# Patient Record
Sex: Male | Born: 1995 | Hispanic: Yes | Marital: Single | Smoking: Former smoker
Health system: Southern US, Community
[De-identification: ages and names within clinical notes are randomized; demographics above are authoritative.]

## PROBLEM LIST (undated history)

## (undated) DIAGNOSIS — J342 Deviated nasal septum: Secondary | ICD-10-CM

## (undated) HISTORY — PX: OTHER SURGICAL HISTORY: SHX169

---

## 2017-07-18 ENCOUNTER — Encounter: Payer: Self-pay | Admitting: Physician Assistant

## 2017-07-18 ENCOUNTER — Ambulatory Visit (INDEPENDENT_AMBULATORY_CARE_PROVIDER_SITE_OTHER): Payer: Worker's Compensation | Admitting: Physician Assistant

## 2017-07-18 VITALS — BP 138/88 | HR 84 | Temp 98.0°F | Resp 17 | Ht 67.5 in | Wt 213.0 lb

## 2017-07-18 DIAGNOSIS — G8929 Other chronic pain: Secondary | ICD-10-CM | POA: Insufficient documentation

## 2017-07-18 DIAGNOSIS — M25512 Pain in left shoulder: Secondary | ICD-10-CM
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
## 2017-07-18 NOTE — Patient Instructions (Signed)
     IF you received an x-ray today, you will receive an invoice from [facility]. Please contact [facility] at [phone] with questions or concerns regarding your invoice.   IF you received labwork today, you will receive an invoice from [facility]. Please contact [facility] at [phone] with questions or concerns regarding your invoice.   Our billing staff will not be able to assist you with questions regarding bills from these companies.  You will be contacted with the lab results as soon as they are available. The fastest way to get your results is to activate your My Chart account. Instructions are located on the last page of this paperwork. If you have not heard from us regarding the results in 2 weeks, please contact this office.     

## 2018-08-23 ENCOUNTER — Encounter: Payer: Self-pay | Admitting: Family

## 2018-08-23 ENCOUNTER — Ambulatory Visit: Payer: 59 | Admitting: Family Medicine

## 2018-08-23 ENCOUNTER — Encounter (INDEPENDENT_AMBULATORY_CARE_PROVIDER_SITE_OTHER): Payer: Self-pay

## 2018-08-23 ENCOUNTER — Ambulatory Visit (INDEPENDENT_AMBULATORY_CARE_PROVIDER_SITE_OTHER): Payer: 59 | Admitting: Family

## 2018-08-23 ENCOUNTER — Ambulatory Visit (INDEPENDENT_AMBULATORY_CARE_PROVIDER_SITE_OTHER)
Admission: RE | Admit: 2018-08-23 | Discharge: 2018-08-23 | Disposition: A | Discharge status: Home / Self Care | Payer: 59 | Source: Ambulatory Visit | Attending: Family | Admitting: Family

## 2018-08-23 VITALS — BP 130/90 | HR 95 | Temp 98.5°F | Ht 67.5 in | Wt 214.1 lb

## 2018-08-23 DIAGNOSIS — R059 Cough, unspecified: Secondary | ICD-10-CM

## 2018-08-23 DIAGNOSIS — R05 Cough: Secondary | ICD-10-CM

## 2018-08-23 MED ORDER — DOXYCYCLINE HYCLATE 100 MG PO TABS: 100.0000 mg | ORAL_TABLET | Freq: Two times a day (BID) | ORAL | 0 refills | Status: DC

## 2018-08-23 MED ORDER — PREDNISONE 20 MG PO TABS: 40.0000 mg | ORAL_TABLET | Freq: Every day | ORAL | 0 refills | Status: DC

## 2018-08-23 NOTE — Progress Notes (Signed)
Jon Rose is a 22 y.o. male with the following history as recorded in EpicCare:  Patient Active Problem List   Diagnosis Date Noted  . Chronic left shoulder pain 07/18/2017    Current Outpatient Medications  Medication Sig Dispense Refill  . doxycycline (VIBRA-TABS) 100 MG tablet Take 1 tablet (100 mg total) by mouth 2 (two) times daily. 20 tablet 0  . predniSONE (DELTASONE) 20 MG tablet Take 2 tablets (40 mg total) by mouth daily with breakfast. 10 tablet 0   No current facility-administered medications for this visit.     Allergies: Patient has no known allergies.  History reviewed. No pertinent past medical history.  Past Surgical History:  Procedure Laterality Date  . left shoulder surgery      History reviewed. No pertinent family history.  Social History   Tobacco Use  . Smoking status: Current Some Day Smoker  . Smokeless tobacco: Never Used  Substance Use Topics  . Alcohol use: No    Subjective:  Seen as a new patient today; Patient presents today with concerns for cough/ congestion x 2-3 days; worse in the past 24 hours; + audible wheezing; not prone to asthma but thinks he has had bronchitis in the past; mentions that he has had pneumonia on at least 2 different occasions;  + occasional smoker; + feverish- has taken Ibuprofen today; no recent travel; works with Database administrator in school at [employer]:     Objective:  Vitals:   08/23/18 1609  BP: 130/90  Pulse: 95  Temp: 98.5 F (36.9 C)  TempSrc: Oral  SpO2: 98%  Weight: 214 lb 1.3 oz (97.1 kg)  Height: 5' 7.5" (1.715 m)    General: Well developed, well nourished, in no acute distress; obviously doe  Skin : Warm and dry.  Head: Normocephalic and atraumatic  Eyes: Sclera and conjunctiva clear; pupils round and reactive to light; extraocular movements intact  Ears: External normal; canals clear; tympanic membranes normal  Oropharynx: Pink, supple. No suspicious lesions  Neck: Supple without thyromegaly, adenopathy   Lungs: Respirations unlabored; wheezing noted in all 4 lobes CVS exam: normal rate and regular rhythm.  Neurologic: Alert and oriented; speech intact; face symmetrical; moves all extremities well; CNII-XII intact without focal deficit  Assessment:   1. Cough     Plan:  Concern for pneumonia; albuterol neb treatment given with some relief; update CXR today; Rx for Doxycycline 100 mg bid x 10 days and prednisone 40 mg qd x 5 days; increase fluids, rest; needs to quit smoking; strict ER/ U/C precautions for upcoming weekend.    No follow-ups on file.  Orders Placed This Encounter  Procedures  . DG Chest 2 View    Standing Status:   Future    Number of Occurrences:   1    Standing Expiration Date:   10/24/2019    Order Specific Question:   Reason for Exam (SYMPTOM  OR DIAGNOSIS REQUIRED)    Answer:   cough    Order Specific Question:   Preferred imaging location?    Answer:   [facility]    Order Specific Question:   Radiology Contrast Protocol - do NOT remove file path    Answer:   [email or web address]    Requested Prescriptions   Signed Prescriptions Disp Refills  . doxycycline (VIBRA-TABS) 100 MG tablet 20 tablet 0    Sig: Take 1 tablet (100 mg total) by mouth 2 (two) times daily.  . predniSONE (DELTASONE) 20 MG tablet 10 tablet 0  Sig: Take 2 tablets (40 mg total) by mouth daily with breakfast.

## 2018-12-02 ENCOUNTER — Ambulatory Visit: Payer: Self-pay | Admitting: Nurse Practitioner

## 2019-02-12 ENCOUNTER — Other Ambulatory Visit: Payer: Self-pay

## 2019-02-12 ENCOUNTER — Encounter (HOSPITAL_COMMUNITY): Payer: Self-pay

## 2019-02-12 ENCOUNTER — Emergency Department (HOSPITAL_COMMUNITY)
Admission: EM | Admit: 2019-02-12 | Discharge: 2019-02-12 | Disposition: A | Discharge status: Home / Self Care | Payer: 59 | Attending: Emergency Medicine | Admitting: Emergency Medicine

## 2019-02-12 DIAGNOSIS — J111 Influenza due to unidentified influenza virus with other respiratory manifestations: Secondary | ICD-10-CM | POA: Diagnosis not present

## 2019-02-12 DIAGNOSIS — Z79899 Other long term (current) drug therapy: Secondary | ICD-10-CM | POA: Insufficient documentation

## 2019-02-12 DIAGNOSIS — R05 Cough: Secondary | ICD-10-CM | POA: Diagnosis present

## 2019-02-12 DIAGNOSIS — F17210 Nicotine dependence, cigarettes, uncomplicated: Secondary | ICD-10-CM | POA: Diagnosis not present

## 2019-02-12 DIAGNOSIS — R69 Illness, unspecified: Secondary | ICD-10-CM

## 2019-02-12 MED ORDER — ONDANSETRON 4 MG PO TBDP: 4.0000 mg | ORAL_TABLET | Freq: Three times a day (TID) | ORAL | 0 refills | Status: DC | PRN

## 2019-02-12 NOTE — ED Triage Notes (Signed)
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 Today, he started to vomit. Patient states he had a temp yesterday of 100.1.

## 2019-02-12 NOTE — Discharge Instructions (Addendum)
Person Under Monitoring Name: Jon Rose  Location: [address] [place] [address]   Infection Prevention Recommendations for Individuals Confirmed to have, or Being Evaluated for, 2019 Novel Coronavirus (COVID-19) Infection Who Receive Care at Home  Individuals who are confirmed to have, or are being evaluated for, COVID-19 should follow the prevention steps below until a healthcare provider or local or state health department says they can return to normal activities.  Stay home except to get medical care You should restrict activities outside your home, except for getting medical care. Do not go to work, school, or public areas, and do not use public transportation or taxis.  Call ahead before visiting your doctor Before your medical appointment, call the healthcare provider and tell them that you have, or are being evaluated for, COVID-19 infection. This will help the healthcare providers office take steps to keep other people from getting infected. Ask your healthcare provider to call the local or state health department.  Monitor your symptoms Seek prompt medical attention if your illness is worsening (e.g., difficulty breathing). Before going to your medical appointment, call the healthcare provider and tell them that you have, or are being evaluated for, COVID-19 infection. Ask your healthcare provider to call the local or state health department.  Wear a facemask You should wear a facemask that covers your nose and mouth when you are in the same room with other people and when you visit a healthcare provider. People who live with or visit you should also wear a facemask while they are in the same room with you.  Separate yourself from other people in your home As much as possible, you should stay in a different room from other people in your home. Also, you should use a separate bathroom, if available.  Avoid sharing household items You  should not share dishes, drinking glasses, cups, eating utensils, towels, bedding, or other items with other people in your home. After using these items, you should wash them thoroughly with soap and water.  Cover your coughs and sneezes Cover your mouth and nose with a tissue when you cough or sneeze, or you can cough or sneeze into your sleeve. Throw used tissues in a lined trash can, and immediately wash your hands with soap and water for at least 20 seconds or use an alcohol-based hand rub.  Wash your [employer] your hands often and thoroughly with soap and water for at least 20 seconds. You can use an alcohol-based hand sanitizer if soap and water are not available and if your hands are not visibly dirty. Avoid touching your eyes, nose, and mouth with unwashed hands.   Prevention Steps for Caregivers and Household Members of Individuals Confirmed to have, or Being Evaluated for, COVID-19 Infection Being Cared for in the Home  If you live with, or provide care at home for, a person confirmed to have, or being evaluated for, COVID-19 infection please follow these guidelines to prevent infection:  Follow healthcare providers instructions Make sure that you understand and can help the patient follow any healthcare provider instructions for all care.  Provide for the patients basic needs You should help the patient with basic needs in the home and provide support for getting groceries, prescriptions, and other personal needs.  Monitor the patients symptoms If they are getting sicker, call his or her medical provider and tell them that the patient has, or is being evaluated for, COVID-19 infection. This will help the  healthcare providers office take steps to keep other people from getting infected. Ask the healthcare provider to call the local or state health department.  Limit the number of people who have contact with the patient If possible, have only one caregiver for the  patient. Other household members should stay in another home or place of residence. If this is not possible, they should stay in another room, or be separated from the patient as much as possible. Use a separate bathroom, if available. Restrict visitors who do not have an essential need to be in the home.  Keep older adults, very young children, and other sick people away from the patient Keep older adults, very young children, and those who have compromised immune systems or chronic health conditions away from the patient. This includes people with chronic heart, lung, or kidney conditions, diabetes, and cancer.  Ensure good ventilation Make sure that shared spaces in the home have good air flow, such as from an air conditioner or an opened window, weather permitting.  Wash your hands often Wash your hands often and thoroughly with soap and water for at least 20 seconds. You can use an alcohol based hand sanitizer if soap and water are not available and if your hands are not visibly dirty. Avoid touching your eyes, nose, and mouth with unwashed hands. Use disposable paper towels to dry your hands. If not available, use dedicated cloth towels and replace them when they become wet.  Wear a facemask and gloves Wear a disposable facemask at all times in the room and gloves when you touch or have contact with the patients blood, body fluids, and/or secretions or excretions, such as sweat, saliva, sputum, nasal mucus, vomit, urine, or feces.  Ensure the mask fits over your nose and mouth tightly, and do not touch it during use. Throw out disposable facemasks and gloves after using them. Do not reuse. Wash your hands immediately after removing your facemask and gloves. If your personal clothing becomes contaminated, carefully remove clothing and launder. Wash your hands after handling contaminated clothing. Place all used disposable facemasks, gloves, and other waste in a lined container before  disposing them with other household waste. Remove gloves and wash your hands immediately after handling these items.  Do not share dishes, glasses, or other household items with the patient Avoid sharing household items. You should not share dishes, drinking glasses, cups, eating utensils, towels, bedding, or other items with a patient who is confirmed to have, or being evaluated for, COVID-19 infection. After the person uses these items, you should wash them thoroughly with soap and water.  Wash laundry thoroughly Immediately remove and wash clothes or bedding that have blood, body fluids, and/or secretions or excretions, such as sweat, saliva, sputum, nasal mucus, vomit, urine, or feces, on them. Wear gloves when handling laundry from the patient. Read and follow directions on labels of laundry or clothing items and detergent. In general, wash and dry with the warmest temperatures recommended on the label.  Clean all areas the individual has used often Clean all touchable surfaces, such as counters, tabletops, doorknobs, bathroom fixtures, toilets, phones, keyboards, tablets, and bedside tables, every day. Also, clean any surfaces that may have blood, body fluids, and/or secretions or excretions on them. Wear gloves when cleaning surfaces the patient has come in contact with. Use a diluted bleach solution (e.g., dilute bleach with 1 part bleach and 10 parts water) or a household disinfectant with a label that says EPA-registered for coronaviruses. To  make a bleach solution at home, add 1 tablespoon of bleach to 1 quart (4 cups) of water. For a larger supply, add  cup of bleach to 1 gallon (16 cups) of water. Read labels of cleaning products and follow recommendations provided on product labels. Labels contain instructions for safe and effective use of the cleaning product including precautions you should take when applying the product, such as wearing gloves or eye protection and making sure you  have good ventilation during use of the product. Remove gloves and wash hands immediately after cleaning.  Monitor yourself for signs and symptoms of illness Caregivers and household members are considered close contacts, should monitor their health, and will be asked to limit movement outside of the home to the extent possible. Follow the monitoring steps for close contacts listed on the symptom monitoring form.   ? If you have additional questions, contact your local health department or call the epidemiologist on call at [phone] (available 24/7). ? This guidance is subject to change. For the most up-to-date guidance from [facility], please refer to their website: YouBlogs.pl

## 2019-02-12 NOTE — ED Provider Notes (Signed)
There was ER he said his wife works in the ER to her ER sounds [facility]GENC[facility] DEPT Provider Note   CSN: [id number] Arrival date & time: 02/12/19  0086    History   Chief Complaint Chief Complaint  Patient presents with  . Cough  . Emesis  . Shortness of Breath    HPI Jon Rose is a 23 y.o. male.     HPI Patient presents with cough low-grade temperature up to 100.1.  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  Also has had nausea vomiting and diarrhea.  Has myalgias.  No abdominal pain.  Minimal sputum production.  Has some nasal congestion 2.  States he was told to come in because of the work.  No definite sick contacts. History reviewed. No pertinent past medical history.  Patient Active Problem List   Diagnosis Date Noted  . Chronic left shoulder pain 07/18/2017    Past Surgical History:  Procedure Laterality Date  . left shoulder surgery          Home Medications    Prior to Admission medications   Medication Sig Start Date End Date Taking? Authorizing Provider  doxycycline (VIBRA-TABS) 100 MG tablet Take 1 tablet (100 mg total) by mouth 2 (two) times daily. 08/23/18   Olive Bass, FNP  ondansetron (ZOFRAN-ODT) 4 MG disintegrating tablet Take 1 tablet (4 mg total) by mouth every 8 (eight) hours as needed for nausea or vomiting. 02/12/19   Benjiman Core, MD  predniSONE (DELTASONE) 20 MG tablet Take 2 tablets (40 mg total) by mouth daily with breakfast. 08/23/18   Olive Bass, FNP    Family History History reviewed. No pertinent family history.  Social History Social History   Tobacco Use  . Smoking status: Current Some Day Smoker    Types: Cigarettes, Cigars  . Smokeless tobacco: Never Used  Substance Use Topics  . Alcohol use: No  . Drug use: No     Allergies   Patient has no known allergies.   Review of Systems Review of Systems  Constitutional: Negative  for appetite change and fever.  HENT: Positive for congestion. Negative for trouble swallowing.   Respiratory: Positive for cough. Negative for shortness of breath.   Cardiovascular: Negative for chest pain.  Gastrointestinal: Positive for diarrhea, nausea and vomiting. Negative for abdominal pain.  Genitourinary: Negative for flank pain.  Musculoskeletal: Positive for myalgias.  Skin: Negative for rash.  Neurological: Negative for weakness.  Psychiatric/Behavioral: Negative for confusion.     Physical Exam Updated Vital Signs BP (!) 153/61 (BP Location: Left Arm)   Pulse 62   Temp 97.8 F (36.6 C) (Oral)   Resp 15   Ht 5\' 8"  (1.727 m)   Wt 95.3 kg   SpO2 95%   BMI 31.93 kg/m   Physical Exam Vitals signs and nursing note reviewed.  Constitutional:      Comments: Patient is wearing a mask  HENT:     Head: Normocephalic.  Pulmonary:     Breath sounds: Normal breath sounds. No decreased breath sounds, wheezing or rhonchi.  Chest:     Chest wall: No tenderness or crepitus.  Abdominal:     Palpations: Abdomen is soft.     Tenderness: There is no abdominal tenderness.  Musculoskeletal:     Right lower leg: No edema.     Left lower leg: No edema.  Skin:    General: Skin is warm.  Capillary Refill: Capillary refill takes less than 2 seconds.  Neurological:     Mental Status: He is alert.      ED Treatments / Results  Labs (all labs ordered are listed, but only abnormal results are displayed) Labs Reviewed - No data to display  EKG None  Radiology No results found.  Procedures Procedures (including critical care time)  Medications Ordered in ED Medications - No data to display   Initial Impression / Assessment and Plan / ED Course  I have reviewed the triage vital signs and the nursing notes.  Pertinent labs & imaging results that were available during my care of the patient were reviewed by me and considered in my medical decision making (see chart  for details).        Patient with URI symptoms.  Also nausea vomiting and diarrhea.  Well-appearing.  Lungs are clear.  Has had a previous pneumonia but doubt pneumonia now.  Discharge home with symptomatic relief.  Instructions given on return to work in 1 week or 72 hours after fevers as long as symptoms are improving.  Jon Rose was evaluated in Emergency Department on 02/12/2019 for the symptoms described in the history of present illness. He was evaluated in the context of the global COVID-19 pandemic, which necessitated consideration that the patient might be at risk for infection with the SARS-CoV-2 virus that causes COVID-19. Institutional protocols and algorithms that pertain to the evaluation of patients at risk for COVID-19 are in a state of rapid change based on information released by regulatory bodies including the CDC and federal and state organizations. These policies and algorithms were followed during the patient's care in the ED.  Final Clinical Impressions(s) / ED Diagnoses   Final diagnoses:  Influenza-like illness    ED Discharge Orders         Ordered    ondansetron (ZOFRAN-ODT) 4 MG disintegrating tablet  Every 8 hours PRN     02/12/19 0905           Benjiman Core, MD 02/12/19 [phone]

## 2019-08-22 ENCOUNTER — Other Ambulatory Visit: Payer: Self-pay

## 2019-08-22 DIAGNOSIS — Z20822 Contact with and (suspected) exposure to covid-19: Secondary | ICD-10-CM

## 2019-08-23 LAB — NOVEL CORONAVIRUS, NAA: SARS-CoV-2, NAA: NOT DETECTED

## 2019-09-17 ENCOUNTER — Other Ambulatory Visit: Payer: Self-pay

## 2019-09-17 ENCOUNTER — Emergency Department (HOSPITAL_COMMUNITY)
Admission: EM | Admit: 2019-09-17 | Discharge: 2019-09-17 | Disposition: A | Discharge status: Home / Self Care | Payer: Managed Care, Other (non HMO) | Attending: Emergency Medicine | Admitting: Emergency Medicine

## 2019-09-17 ENCOUNTER — Encounter (HOSPITAL_COMMUNITY): Payer: Self-pay | Admitting: Obstetrics and Gynecology

## 2019-09-17 DIAGNOSIS — Z20828 Contact with and (suspected) exposure to other viral communicable diseases: Secondary | ICD-10-CM | POA: Insufficient documentation

## 2019-09-17 DIAGNOSIS — R0981 Nasal congestion: Secondary | ICD-10-CM | POA: Diagnosis present

## 2019-09-17 DIAGNOSIS — F17290 Nicotine dependence, other tobacco product, uncomplicated: Secondary | ICD-10-CM | POA: Insufficient documentation

## 2019-09-17 DIAGNOSIS — J069 Acute upper respiratory infection, unspecified: Secondary | ICD-10-CM | POA: Insufficient documentation

## 2019-09-17 DIAGNOSIS — F17210 Nicotine dependence, cigarettes, uncomplicated: Secondary | ICD-10-CM | POA: Insufficient documentation

## 2019-09-17 HISTORY — DX: Deviated nasal septum: J34.2

## 2019-09-17 LAB — SARS CORONAVIRUS 2 (TAT 6-24 HRS): SARS Coronavirus 2: NEGATIVE

## 2019-09-17 MED ORDER — ACETAMINOPHEN 325 MG PO TABS
650.0000 mg | ORAL_TABLET | Freq: Once | ORAL | Status: AC
  Administered 2019-09-17: 13:00:00 650 mg via ORAL
  Filled 2019-09-17: qty 2

## 2019-09-17 NOTE — ED Notes (Signed)
Pt is alert and oriented x 4 and is verbally responsive. Pt reports 6/10 today aches and most discomfort is in neck .

## 2019-09-17 NOTE — Discharge Instructions (Addendum)
Return if any problems.

## 2019-09-17 NOTE — ED Provider Notes (Signed)
[facility] DEPT Provider Note   CSN: [id number] Arrival date & time: 09/17/19  1023     History   Chief Complaint Chief Complaint  Patient presents with  . Generalized Body Aches  . Neck Pain  . Nasal Congestion    HPI Jon Rose is a 23 y.o. male.     The history is provided by the patient. No language interpreter was used.  Muscle Pain This is a new problem. The problem occurs constantly. The problem has been gradually worsening. Pertinent negatives include no shortness of breath. Nothing aggravates the symptoms. Nothing relieves the symptoms. He has tried nothing for the symptoms. The treatment provided no relief.   Pt reports he developed a cough yesterday.  Pt complains of a headache, sinus congestion and drainage.  Pt reports recent travel,  He is unsure of covid exposures  Past Medical History:  Diagnosis Date  . Deviated septum     Patient Active Problem List   Diagnosis Date Noted  . Chronic left shoulder pain 07/18/2017    Past Surgical History:  Procedure Laterality Date  . left shoulder surgery          Home Medications    Prior to Admission medications   Medication Sig Start Date End Date Taking? Authorizing Provider  doxycycline (VIBRA-TABS) 100 MG tablet Take 1 tablet (100 mg total) by mouth 2 (two) times daily. Patient not taking: Reported on 09/17/2019 08/23/18   Marrian Salvage, FNP  ondansetron (ZOFRAN-ODT) 4 MG disintegrating tablet Take 1 tablet (4 mg total) by mouth every 8 (eight) hours as needed for nausea or vomiting. Patient not taking: Reported on 09/17/2019 02/12/19   Davonna Belling, MD  predniSONE (DELTASONE) 20 MG tablet Take 2 tablets (40 mg total) by mouth daily with breakfast. Patient not taking: Reported on 09/17/2019 08/23/18   Marrian Salvage, FNP    Family History No family history on file.  Social History Social History   Tobacco Use  . Smoking status: Current Some Day  Smoker    Packs/day: 0.10    Types: Cigarettes, Cigars  . Smokeless tobacco: Never Used  Substance Use Topics  . Alcohol use: No  . Drug use: Yes    Types: Marijuana     Allergies   Patient has no known allergies.   Review of Systems Review of Systems  Respiratory: Negative for shortness of breath.   All other systems reviewed and are negative.    Physical Exam Updated Vital Signs BP (!) 151/74 (BP Location: Left Arm)   Pulse 73   Temp 98.3 F (36.8 C) (Oral)   Resp 16   SpO2 99%   Physical Exam Vitals signs and nursing note reviewed.  Constitutional:      Appearance: He is well-developed.  HENT:     Head: Normocephalic and atraumatic.     Right Ear: Tympanic membrane normal.     Left Ear: Tympanic membrane normal.     Nose: Nose normal.     Mouth/Throat:     Mouth: Mucous membranes are moist.  Eyes:     Conjunctiva/sclera: Conjunctivae normal.  Neck:     Musculoskeletal: Normal range of motion and neck supple.  Cardiovascular:     Rate and Rhythm: Normal rate and regular rhythm.     Heart sounds: No murmur.  Pulmonary:     Effort: Pulmonary effort is normal. No respiratory distress.     Breath sounds: Normal breath sounds.  Abdominal:  Palpations: Abdomen is soft.     Tenderness: There is no abdominal tenderness.  Musculoskeletal: Normal range of motion.  Skin:    General: Skin is warm and dry.  Neurological:     General: No focal deficit present.     Mental Status: He is alert.  Psychiatric:        Mood and Affect: Mood normal.      ED Treatments / Results  Labs (all labs ordered are listed, but only abnormal results are displayed) Labs Reviewed  SARS CORONAVIRUS 2 (TAT 6-24 HRS)    EKG None  Radiology No results found.  Procedures Procedures (including critical care time)  Medications Ordered in ED Medications  acetaminophen (TYLENOL) tablet 650 mg (has no administration in time range)     Initial Impression / Assessment  and Plan / ED Course  I have reviewed the triage vital signs and the nursing notes.  Pertinent labs & imaging results that were available during my care of the patient were reviewed by me and considered in my medical decision making (see chart for details).        MDM  Pt given tylenol.  Covid test ordered.  Pt advised he will need to quarantine until results.  Pt may have a a viral uri.   Final Clinical Impressions(s) / ED Diagnoses   Final diagnoses:  Upper respiratory tract infection, unspecified type    ED Discharge Orders    None    An After Visit Summary was printed and given to the patient.    Elson Areas, [place] 09/17/19 1240    Terrilee Files, MD 09/17/19 1949

## 2020-02-03 ENCOUNTER — Other Ambulatory Visit: Payer: Self-pay

## 2020-02-03 ENCOUNTER — Emergency Department (HOSPITAL_COMMUNITY)
Admission: EM | Admit: 2020-02-03 | Discharge: 2020-02-03 | Disposition: A | Discharge status: Home / Self Care | Payer: Managed Care, Other (non HMO) | Attending: Emergency Medicine | Admitting: Emergency Medicine

## 2020-02-03 ENCOUNTER — Encounter (HOSPITAL_COMMUNITY): Payer: Self-pay | Admitting: Emergency Medicine

## 2020-02-03 DIAGNOSIS — F17210 Nicotine dependence, cigarettes, uncomplicated: Secondary | ICD-10-CM | POA: Insufficient documentation

## 2020-02-03 DIAGNOSIS — J029 Acute pharyngitis, unspecified: Secondary | ICD-10-CM | POA: Insufficient documentation

## 2020-02-03 DIAGNOSIS — Z20822 Contact with and (suspected) exposure to covid-19: Secondary | ICD-10-CM | POA: Insufficient documentation

## 2020-02-03 LAB — SARS CORONAVIRUS 2 (TAT 6-24 HRS): SARS Coronavirus 2: NEGATIVE

## 2020-02-03 LAB — GROUP A STREP BY PCR: Group A Strep by PCR: NOT DETECTED

## 2020-02-03 MED ORDER — DEXAMETHASONE 4 MG PO TABS
10.0000 mg | ORAL_TABLET | Freq: Once | ORAL | Status: AC
  Administered 2020-02-03: 10 mg via ORAL
  Filled 2020-02-03: qty 2

## 2020-02-03 NOTE — ED Provider Notes (Signed)
[facility] DEPT Provider Note   CSN: [id number] Arrival date & time: 02/03/20  2637     History Chief Complaint  Patient presents with  . Sore Throat    Jon Rose is a 24 y.o. male.  The history is provided by the patient. No language interpreter was used.  Sore Throat   Jon Rose is a 24 y.o. male who presents to the Emergency Department complaining of sore throat. He presents the emergency department complaining of sore throat the last 2 to 3 days. Pain is located throughout his entire throat. His worse with swallowing. He had a subjective fever last night. He denies any shortness of breath, nausea, vomiting, cough. Symptoms are mild to moderate in nature. No known sick contacts or COVID 19 exposures. He has no medical problems and takes no medications.    Past Medical History:  Diagnosis Date  . Deviated septum     Patient Active Problem List   Diagnosis Date Noted  . Chronic left shoulder pain 07/18/2017    Past Surgical History:  Procedure Laterality Date  . left shoulder surgery         No family history on file.  Social History   Tobacco Use  . Smoking status: Current Some Day Smoker    Packs/day: 0.10    Types: Cigarettes, Cigars  . Smokeless tobacco: Never Used  Substance Use Topics  . Alcohol use: No  . Drug use: Yes    Types: Marijuana    Home Medications Prior to Admission medications   Not on File    Allergies    Patient has no known allergies.  Review of Systems   Review of Systems  All other systems reviewed and are negative.   Physical Exam Updated Vital Signs BP 121/78 (BP Location: Left Arm)   Pulse 65   Temp (!) 97.5 F (36.4 C) (Oral)   Resp 18   Ht 5\' 8"  (1.727 m)   Wt 95.3 kg   SpO2 100%   BMI 31.93 kg/m   Physical Exam Vitals and nursing note reviewed.  Constitutional:      Appearance: He is well-developed.  HENT:     Head: Normocephalic and atraumatic.     Comments:  TMs clear bilaterally. There is moderate tonsillar  erythema and edema bilaterally without any exudates. No PTA. Cardiovascular:     Rate and Rhythm: Normal rate and regular rhythm.     Heart sounds: No murmur.  Pulmonary:     Effort: Pulmonary effort is normal. No respiratory distress.     Breath sounds: Normal breath sounds.  Abdominal:     Palpations: Abdomen is soft.     Tenderness: There is no abdominal tenderness. There is no guarding or rebound.  Musculoskeletal:        General: No tenderness.     Cervical back: Neck supple.  Lymphadenopathy:     Cervical: No cervical adenopathy.  Skin:    General: Skin is warm and dry.  Neurological:     Mental Status: He is alert and oriented to person, place, and time.  Psychiatric:        Behavior: Behavior normal.     ED Results / Procedures / Treatments   Labs (all labs ordered are listed, but only abnormal results are displayed) Labs Reviewed  GROUP A STREP BY PCR  SARS CORONAVIRUS 2 (TAT 6-24 HRS)    EKG None  Radiology No results found.  Procedures Procedures (including critical care time)  Medications Ordered in ED Medications  dexamethasone (DECADRON) tablet 10 mg (10 mg Oral Given 02/03/20 1045)    ED Course  I have reviewed the triage vital signs and the nursing notes.  Pertinent labs & imaging results that were available during my care of the patient were reviewed by me and considered in my medical decision making (see chart for details).    MDM Rules/Calculators/A&P                     Patient here for evaluation of a few days of sore throat. He is non-toxic appearing on evaluation. There is no evidence of PTA, RPA, epiglottitis based on history and examination. He is negative for strep pharyngitis. Will treat with Decadron for symptom management. Discussed with patient home care for pharyngitis, likely viral origin. Discussed outpatient follow-up and return precautions.  Final Clinical Impression(s) / ED  Diagnoses Final diagnoses:  Pharyngitis, unspecified etiology    Rx / DC Orders ED Discharge Orders    None       Tilden Fossa, MD 02/03/20 1105

## 2020-02-03 NOTE — ED Triage Notes (Signed)
Per pt, states sore throat for 3-4 days-states he thinks he has strep

## 2020-02-23 ENCOUNTER — Other Ambulatory Visit: Payer: Self-pay

## 2020-02-23 ENCOUNTER — Ambulatory Visit: Payer: 59 | Attending: Internal Medicine

## 2020-02-23 DIAGNOSIS — Z23 Encounter for immunization: Secondary | ICD-10-CM

## 2020-02-23 NOTE — Progress Notes (Signed)
   Covid-19 Vaccination Clinic  Name:  [place] Bolander    MRN: [id number] DOB: Oct 17, 1996  02/23/2020  Mr. Andel was observed post Covid-19 immunization for 15 minutes without incident. He was provided with Vaccine Information Sheet and instruction to access the V-Safe system.   Mr. Schriefer was instructed to call 911 with any severe reactions post vaccine: Marland Kitchen Difficulty breathing  . Swelling of face and throat  . A fast heartbeat  . A bad rash all over body  . Dizziness and weakness   Immunizations Administered    Name Date Dose VIS Date Route   Pfizer COVID-19 Vaccine 02/23/2020 10:16 AM 0.3 mL 10/24/2019 Intramuscular   Manufacturer: [employer], [employer]   Lot: [id number]   NDC: [address]-4

## 2020-03-16 ENCOUNTER — Ambulatory Visit: Payer: 59 | Attending: Internal Medicine

## 2020-03-16 DIAGNOSIS — Z23 Encounter for immunization: Secondary | ICD-10-CM

## 2020-03-16 NOTE — Progress Notes (Signed)
   Covid-19 Vaccination Clinic  Name:  Jon Rose    MRN: [id number] DOB: [birth year]  03/16/2020  [name] was observed post Covid-19 immunization for 15 minutes without incident. He was provided with Vaccine Information Sheet and instruction to access the V-Safe system.   [name] was instructed to call 911 with any severe reactions post vaccine: Marland Kitchen Difficulty breathing  . Swelling of face and throat  . A fast heartbeat  . A bad rash all over body  . Dizziness and weakness   Immunizations Administered    Name Date Dose VIS Date Route   Pfizer COVID-19 Vaccine 03/16/2020  2:15 PM 0.3 mL 01/07/2019 Intramuscular   Manufacturer: [employer], [employer]   Lot: [id number]   NDC: [address]-5

## 2020-07-12 ENCOUNTER — Other Ambulatory Visit: Payer: Self-pay

## 2020-07-12 ENCOUNTER — Encounter (HOSPITAL_COMMUNITY): Payer: Self-pay | Admitting: Emergency Medicine

## 2020-07-12 ENCOUNTER — Ambulatory Visit (HOSPITAL_COMMUNITY)
Admission: EM | Admit: 2020-07-12 | Discharge: 2020-07-12 | Disposition: A | Discharge status: Home / Self Care | Payer: 59 | Attending: Family Medicine | Admitting: Family Medicine

## 2020-07-12 DIAGNOSIS — F17210 Nicotine dependence, cigarettes, uncomplicated: Secondary | ICD-10-CM | POA: Diagnosis not present

## 2020-07-12 DIAGNOSIS — A084 Viral intestinal infection, unspecified: Secondary | ICD-10-CM | POA: Insufficient documentation

## 2020-07-12 DIAGNOSIS — R6883 Chills (without fever): Secondary | ICD-10-CM | POA: Insufficient documentation

## 2020-07-12 DIAGNOSIS — R531 Weakness: Secondary | ICD-10-CM | POA: Insufficient documentation

## 2020-07-12 DIAGNOSIS — Z20822 Contact with and (suspected) exposure to covid-19: Secondary | ICD-10-CM | POA: Diagnosis not present

## 2020-07-12 DIAGNOSIS — R519 Headache, unspecified: Secondary | ICD-10-CM | POA: Insufficient documentation

## 2020-07-12 MED ORDER — ONDANSETRON HCL 8 MG PO TABS: 8.0000 mg | ORAL_TABLET | Freq: Three times a day (TID) | ORAL | 0 refills | Status: DC | PRN

## 2020-07-12 NOTE — ED Provider Notes (Signed)
[facility]    CSN: [id number] Arrival date & time: 07/12/20  1829      History   Chief Complaint Chief Complaint  Patient presents with  . Headache    HPI Jon Rose is a 24 y.o. male.   HPI Patient has full Covid vaccinations.  He has been sick just since this afternoon.  Nausea vomiting and diarrhea.  Headache and some chills.  Feels very weak and tired. Works as a [employer]. Is good about wearing a mask.  Tries to sanitize hands frequently. No known exposure to Covid Past Medical History:  Diagnosis Date  . Deviated septum     Patient Active Problem List   Diagnosis Date Noted  . Chronic left shoulder pain 07/18/2017    Past Surgical History:  Procedure Laterality Date  . left shoulder surgery         Home Medications    Prior to Admission medications   Medication Sig Start Date End Date Taking? Authorizing Provider  ondansetron (ZOFRAN) 8 MG tablet Take 1 tablet (8 mg total) by mouth every 8 (eight) hours as needed for nausea or vomiting. 07/12/20   Eustace Moore, MD    Family History History reviewed. No pertinent family history.  Social History Social History   Tobacco Use  . Smoking status: Current Some Day Smoker    Packs/day: 0.10    Types: Cigarettes, Cigars  . Smokeless tobacco: Never Used  Vaping Use  . Vaping Use: Never used  Substance Use Topics  . Alcohol use: No  . Drug use: Yes    Types: Marijuana     Allergies   Patient has no known allergies.   Review of Systems Review of Systems See HPI  Physical Exam Triage Vital Signs ED Triage Vitals  Enc Vitals Group     BP 07/12/20 2011 (!) 142/76     Pulse Rate 07/12/20 2011 77     Resp 07/12/20 2011 18     Temp 07/12/20 2011 98.1 F (36.7 C)     Temp Source 07/12/20 2011 Oral     SpO2 07/12/20 2011 96 %     Weight --      Height --      Head Circumference --      Peak Flow --      Pain Score 07/12/20 2009 0     Pain Loc --      Pain Edu?  --      Excl. in GC? --    No data found.  Updated Vital Signs BP (!) 142/76 (BP Location: Right Arm)   Pulse 77   Temp 98.1 F (36.7 C) (Oral)   Resp 18   SpO2 96%     Physical Exam Constitutional:      General: He is not in acute distress.    Appearance: He is well-developed.  HENT:     Head: Normocephalic and atraumatic.     Mouth/Throat:     Mouth: Mucous membranes are moist.  Eyes:     Conjunctiva/sclera: Conjunctivae normal.     Pupils: Pupils are equal, round, and reactive to light.  Cardiovascular:     Rate and Rhythm: Normal rate.  Pulmonary:     Effort: Pulmonary effort is normal. No respiratory distress.  Abdominal:     General: There is no distension.     Palpations: Abdomen is soft.     Comments: Rounded abdomen.  Bowel sounds active.  Mild tenderness to deep  palpation in both lower quadrants.  No guarding or rebound.  No organomegaly  Musculoskeletal:        General: Normal range of motion.     Cervical back: Normal range of motion.  Skin:    General: Skin is warm and dry.  Neurological:     Mental Status: He is alert.  Psychiatric:        Behavior: Behavior normal.      UC Treatments / Results  Labs (all labs ordered are listed, but only abnormal results are displayed) Labs Reviewed  SARS CORONAVIRUS 2 (TAT 6-24 HRS)    EKG   Radiology No results found.  Procedures Procedures (including critical care time)  Medications Ordered in UC Medications - No data to display  Initial Impression / Assessment and Plan / UC Course  I have reviewed the triage vital signs and the nursing notes.  Pertinent labs & imaging results that were available during my care of the patient were reviewed by me and considered in my medical decision making (see chart for details).     Reviewed rehydration.  Usual course of gastroenteritis.  Doubt Covid Final Clinical Impressions(s) / UC Diagnoses   Final diagnoses:  Viral gastroenteritis     Discharge  Instructions     Drink plenty of fluids to prevent dehydration Take Zofran for nausea and vomiting Get lots of rest.  Bland diet, nothing spicy or fried for a few days You should gradually improve over the next couple of days   ED Prescriptions    Medication Sig Dispense Auth. Provider   ondansetron (ZOFRAN) 8 MG tablet Take 1 tablet (8 mg total) by mouth every 8 (eight) hours as needed for nausea or vomiting. 20 tablet Eustace Moore, MD     PDMP not reviewed this encounter.   Eustace Moore, MD 07/12/20 2031

## 2020-07-12 NOTE — Discharge Instructions (Signed)
Drink plenty of fluids to prevent dehydration Take Zofran for nausea and vomiting Get lots of rest.  Bland diet, nothing spicy or fried for a few days You should gradually improve over the next couple of days

## 2020-07-12 NOTE — ED Triage Notes (Signed)
Pt presents with nausea, vomiting and headache and chills that start this afternoon.   Denies loss of taste, chest pain, sob.

## 2020-07-13 LAB — SARS CORONAVIRUS 2 (TAT 6-24 HRS): SARS Coronavirus 2: NEGATIVE

## 2020-12-30 ENCOUNTER — Emergency Department (HOSPITAL_COMMUNITY)
Admission: EM | Admit: 2020-12-30 | Discharge: 2020-12-30 | Disposition: A | Discharge status: Home / Self Care | Payer: Managed Care, Other (non HMO) | Attending: Emergency Medicine | Admitting: Emergency Medicine

## 2020-12-30 ENCOUNTER — Encounter (HOSPITAL_COMMUNITY): Payer: Self-pay

## 2020-12-30 ENCOUNTER — Other Ambulatory Visit: Payer: Self-pay

## 2020-12-30 DIAGNOSIS — W25XXXA Contact with sharp glass, initial encounter: Secondary | ICD-10-CM | POA: Insufficient documentation

## 2020-12-30 DIAGNOSIS — S61212A Laceration without foreign body of right middle finger without damage to nail, initial encounter: Secondary | ICD-10-CM | POA: Diagnosis not present

## 2020-12-30 DIAGNOSIS — F17210 Nicotine dependence, cigarettes, uncomplicated: Secondary | ICD-10-CM | POA: Diagnosis not present

## 2020-12-30 DIAGNOSIS — S6991XA Unspecified injury of right wrist, hand and finger(s), initial encounter: Secondary | ICD-10-CM | POA: Diagnosis present

## 2020-12-30 DIAGNOSIS — S61411A Laceration without foreign body of right hand, initial encounter: Secondary | ICD-10-CM

## 2020-12-30 DIAGNOSIS — Y9389 Activity, other specified: Secondary | ICD-10-CM | POA: Diagnosis not present

## 2020-12-30 NOTE — ED Triage Notes (Signed)
Pt has very small lac on rt hand/middle finger knuckle area. Bleeding controlled, denies anticoags. Pt states he hit glass thinking it was plastic to try and frighten gf as a joke. States he's up to date on tetanus

## 2020-12-30 NOTE — ED Notes (Signed)
Provider applied dermabond and 2 steri-strips to pt lac. Pt verbalized understanding to keep area clean and dry and to look out for any s/s of infection. Pt does not have questions, ready for dc

## 2020-12-30 NOTE — Discharge Instructions (Signed)
If you notice any signs of infection such as swelling, redness, discharge please have it reevaluated by a medical provider.  Clean it with soap and water starting tomorrow.

## 2020-12-30 NOTE — ED Provider Notes (Signed)
[facility]ER[facility]ENC[facility] DEPT Provider Note   CSN: [id number] Arrival date & time: 12/30/20  8299     History No chief complaint on file.   Jon Rose is a 25 y.o. male.  Patient states that about an hour ago he was "playing a prank" on his girlfriend and was going to punch a statue that he thought was plastic.  It turns out that the statue was made of glass and when he hit it it cut his right hand.  His girlfriend cleaned it up for him when it was bleeding.  He then came to emergency department to see if it needed stitches.  Patient has full sensation in his fingertips.  The area around the cut on the third MCP joint he says is "numb" but otherwise has no weakness no decreased range of motion.  Does not take any chronic medications.  Does not have any significant medical history.  Has not taken any medications for the pain.  Drove himself over here.        Past Medical History:  Diagnosis Date  . Deviated septum     Patient Active Problem List   Diagnosis Date Noted  . Chronic left shoulder pain 07/18/2017    Past Surgical History:  Procedure Laterality Date  . left shoulder surgery         No family history on file.  Social History   Tobacco Use  . Smoking status: Current Some Day Smoker    Packs/day: 0.10    Types: Cigarettes, Cigars  . Smokeless tobacco: Never Used  Vaping Use  . Vaping Use: Never used  Substance Use Topics  . Alcohol use: No  . Drug use: Yes    Types: Marijuana    Home Medications Prior to Admission medications   Medication Sig Start Date End Date Taking? Authorizing Provider  ondansetron (ZOFRAN) 8 MG tablet Take 1 tablet (8 mg total) by mouth every 8 (eight) hours as needed for nausea or vomiting. 07/12/20   Eustace Moore, MD    Allergies    Patient has no known allergies.  Review of Systems   Review of Systems  All other systems reviewed and are negative.   Physical Exam Updated Vital Signs BP (!)  150/91 (BP Location: Right Arm)   Pulse 60   Temp 98.1 F (36.7 C) (Oral)   Resp 18   SpO2 97%   Physical Exam Constitutional:      General: He is not in acute distress.    Appearance: Normal appearance.  HENT:     Head: Normocephalic and atraumatic.  Musculoskeletal:     Left hand: Normal.     Comments: Mild swelling along the third MCP joint.  Approximately 3 mm horizontal laceration overlying the MCP joint of the third digit.  Currently hemostatic.  Neurological:     Mental Status: He is alert.     Sensory: Sensation is intact.     Comments: Sensation to light touch present on the third digit of the right hand distal to the laceration.     ED Results / Procedures / Treatments   Labs (all labs ordered are listed, but only abnormal results are displayed) Labs Reviewed - No data to display  EKG None  Radiology No results found.  Procedures .Marland KitchenLaceration Repair  Date/Time: 12/30/2020 10:02 AM Performed by: Sandre Kitty, MD Authorized by: Alvira Monday, MD   Consent:    Consent obtained:  Verbal   Consent given by:  Patient   Risks, benefits, and alternatives were discussed: yes     Risks discussed:  Infection   Alternatives discussed:  No treatment Anesthesia:    Anesthesia method:  None Laceration details:    Location:  Hand   Hand location:  R hand, dorsum   Length (cm):  0.3   Depth (mm):  1 Exploration:    Wound extent: no nerve damage noted, no tendon damage noted and no underlying fracture noted   Treatment:    Area cleansed with:  Saline   Debridement:  None Skin repair:    Repair method:  Tissue adhesive and Steri-Strips   Number of Steri-Strips:  2 Approximation:    Approximation:  Close Repair type:    Repair type:  Simple Post-procedure details:    Dressing:  Open (no dressing)   Procedure completion:  Tolerated well, no immediate complications     Medications Ordered in ED Medications - No data to display  ED Course  I have  reviewed the triage vital signs and the nursing notes.  Pertinent labs & imaging results that were available during my care of the patient were reviewed by me and considered in my medical decision making (see chart for details).    MDM Rules/Calculators/A&P                          Patient presents with laceration to right dorsal aspect of third MCP joint.  Approximately 3 mm in length by 1 mm in depth.  No signs of debris in the wound.  Occurred while punching something he thought was plastic but turned out to be glass.  No loss of sensation, range of motion, strength in the hand or fingers distally to the injury.  Wound was cleaned with alcohol, repaired with Dermabond and 2 Steri-Strips placed over top.  Discussed signs of infection with patient and reasons to return to seek medical attention.  Patient declined tetanus vaccine.  States he thinks he has had it within the past 10 years. Final Clinical Impression(s) / ED Diagnoses Final diagnoses:  None    Rx / DC Orders ED Discharge Orders    None       Sandre Kitty, MD 12/30/20 1007    Alvira Monday, MD 12/31/20 0005

## 2021-07-15 ENCOUNTER — Other Ambulatory Visit: Payer: Self-pay

## 2021-07-15 ENCOUNTER — Encounter (HOSPITAL_COMMUNITY): Payer: Self-pay

## 2021-07-15 ENCOUNTER — Emergency Department (HOSPITAL_COMMUNITY)
Admission: EM | Admit: 2021-07-15 | Discharge: 2021-07-15 | Disposition: A | Discharge status: Home / Self Care | Payer: 59 | Attending: Emergency Medicine | Admitting: Emergency Medicine

## 2021-07-15 DIAGNOSIS — Z87891 Personal history of nicotine dependence: Secondary | ICD-10-CM | POA: Diagnosis not present

## 2021-07-15 DIAGNOSIS — U071 COVID-19: Secondary | ICD-10-CM | POA: Diagnosis not present

## 2021-07-15 DIAGNOSIS — J069 Acute upper respiratory infection, unspecified: Secondary | ICD-10-CM | POA: Diagnosis not present

## 2021-07-15 NOTE — Discharge Instructions (Addendum)
Follow-up with your primary care doctor as needed

## 2021-07-15 NOTE — ED Triage Notes (Signed)
Pt states he went to [place] last week, started having chills, body aches, fever, n/v on Sunday. Took a covid test Monday that was positive. Pt states all his symptoms have resolved and he feels better, he just wants to know if he is clear to be around his fiance.

## 2021-07-15 NOTE — ED Provider Notes (Signed)
[facility] EMERGENCY DEPARTMENT Provider Note   CSN: [id number] Arrival date & time: 07/15/21  9924     History Chief Complaint  Patient presents with   Covid Positive    Jon Rose is a 25 y.o. male.  25 year old male presents with request for rapid/antigen COVID testing. Reports traveling to [place] this past weekend, started to feel unwell Saturday but was possibly hung over and didn't think much of it until Sunday when he felt like he had hit a wall. Patient took a COVID test Monday which was positive, had a second positive test as well. Patient's symptoms have resolved, requesting rapid test so that he can be around his fiance who works in healthcare with elderly patients. No significant health problems, is a former smoker. No other complaints or concerns.       Past Medical History:  Diagnosis Date   Deviated septum     Patient Active Problem List   Diagnosis Date Noted   Chronic left shoulder pain 07/18/2017    Past Surgical History:  Procedure Laterality Date   left shoulder surgery         No family history on file.  Social History   Tobacco Use   Smoking status: Former    Packs/day: 0.10    Types: Cigarettes, Cigars   Smokeless tobacco: Never  Vaping Use   Vaping Use: Never used  Substance Use Topics   Alcohol use: No   Drug use: Yes    Types: Marijuana    Home Medications Prior to Admission medications   Medication Sig Start Date End Date Taking? Authorizing Provider  ondansetron (ZOFRAN) 8 MG tablet Take 1 tablet (8 mg total) by mouth every 8 (eight) hours as needed for nausea or vomiting. Patient not taking: Reported on 12/30/2020 07/12/20   Eustace Moore, MD    Allergies    Patient has no known allergies.  Review of Systems   Review of Systems  Constitutional:  Negative for fever.  HENT:  Negative for congestion and sore throat.   Respiratory:  Negative for cough.   Musculoskeletal:  Negative for arthralgias and  myalgias.  Skin:  Negative for rash.  Allergic/Immunologic: Negative for immunocompromised state.  Hematological:  Negative for adenopathy.  All other systems reviewed and are negative.  Physical Exam Updated Vital Signs BP (!) 150/83 (BP Location: Left Arm)   Pulse 69   Temp 98 F (36.7 C) (Oral)   Resp 16   SpO2 97%   Physical Exam Vitals and nursing note reviewed.  Constitutional:      General: He is not in acute distress.    Appearance: He is well-developed. He is not diaphoretic.  HENT:     Head: Normocephalic and atraumatic.     Right Ear: Tympanic membrane and ear canal normal.     Left Ear: Tympanic membrane and ear canal normal.     Nose: Congestion present.     Mouth/Throat:     Mouth: Mucous membranes are moist.     Pharynx: Posterior oropharyngeal erythema present. No oropharyngeal exudate.  Eyes:     Conjunctiva/sclera: Conjunctivae normal.  Cardiovascular:     Rate and Rhythm: Normal rate and regular rhythm.     Heart sounds: Normal heart sounds.  Pulmonary:     Effort: Pulmonary effort is normal.     Breath sounds: Normal breath sounds.  Musculoskeletal:     Cervical back: Neck supple.  Lymphadenopathy:     Cervical:  No cervical adenopathy.  Skin:    General: Skin is warm and dry.     Findings: No erythema or rash.  Neurological:     Mental Status: He is alert and oriented to person, place, and time.  Psychiatric:        Behavior: Behavior normal.    ED Results / Procedures / Treatments   Labs (all labs ordered are listed, but only abnormal results are displayed) Labs Reviewed - No data to display  EKG None  Radiology No results found.  Procedures Procedures   Medications Ordered in ED Medications - No data to display  ED Course  I have reviewed the triage vital signs and the nursing notes.  Pertinent labs & imaging results that were available during my care of the patient were reviewed by me and considered in my medical decision  making (see chart for details).  Clinical Course as of 07/15/21 1004  Fri Jul 15, 2021  6440 25 year old male with request for COVID antigen test as above. Patient is well appearing on exam, vitals reviewed, mildly hypertensive with O2 sat 97% on room air.  Found to have nasal congestion with post nasal drip. Lungs clear. Rapid testing for community is not available from the ER at this time but may be tested at a pharmacy or obtain home test. Discussed a rapid test at this point is not proven definitively accurate. Recommend completing 10 day quarantine/masking.  [LM]  1004 Jon Rose was evaluated in Emergency Department on 07/15/2021 for the symptoms described in the history of present illness. He was evaluated in the context of the global COVID-19 pandemic, which necessitated consideration that the patient might be at risk for infection with the SARS-CoV-2 virus that causes COVID-19. Institutional protocols and algorithms that pertain to the evaluation of patients at risk for COVID-19 are in a state of rapid change based on information released by regulatory bodies including the CDC and federal and state organizations. These policies and algorithms were followed during the patient's care in the ED.  [LM]    Clinical Course User Index [LM] Alden Hipp   MDM Rules/Calculators/A&P                           Final Clinical Impression(s) / ED Diagnoses Final diagnoses:  Viral URI    Rx / DC Orders ED Discharge Orders     None        Jeannie Fend, PA-C 07/15/21 1004    Terald Sleeper, MD 07/15/21 1341

## 2021-10-05 ENCOUNTER — Other Ambulatory Visit: Payer: Self-pay

## 2021-10-05 ENCOUNTER — Emergency Department (HOSPITAL_COMMUNITY)
Admission: EM | Admit: 2021-10-05 | Discharge: 2021-10-05 | Disposition: A | Discharge status: Home / Self Care | Payer: Managed Care, Other (non HMO) | Attending: Emergency Medicine | Admitting: Emergency Medicine

## 2021-10-05 ENCOUNTER — Encounter (HOSPITAL_COMMUNITY): Payer: Self-pay

## 2021-10-05 DIAGNOSIS — M62830 Muscle spasm of back: Secondary | ICD-10-CM | POA: Insufficient documentation

## 2021-10-05 DIAGNOSIS — M549 Dorsalgia, unspecified: Secondary | ICD-10-CM | POA: Diagnosis present

## 2021-10-05 DIAGNOSIS — Z87891 Personal history of nicotine dependence: Secondary | ICD-10-CM | POA: Diagnosis not present

## 2021-10-05 MED ORDER — CELECOXIB 200 MG PO CAPS: 200.0000 mg | ORAL_CAPSULE | Freq: Two times a day (BID) | ORAL | 0 refills | Status: DC

## 2021-10-05 MED ORDER — METHOCARBAMOL 500 MG PO TABS: 500.0000 mg | ORAL_TABLET | Freq: Three times a day (TID) | ORAL | 0 refills | Status: DC | PRN

## 2021-10-05 MED ORDER — KETOROLAC TROMETHAMINE 60 MG/2ML IM SOLN
60.0000 mg | Freq: Once | INTRAMUSCULAR | Status: AC
  Administered 2021-10-05: 60 mg via INTRAMUSCULAR
  Filled 2021-10-05: qty 2

## 2021-10-05 MED ORDER — DEXAMETHASONE SODIUM PHOSPHATE 10 MG/ML IJ SOLN
10.0000 mg | Freq: Once | INTRAMUSCULAR | Status: AC
  Administered 2021-10-05: 10 mg via INTRAMUSCULAR
  Filled 2021-10-05: qty 1

## 2021-10-05 NOTE — ED Provider Notes (Signed)
[facility] EMERGENCY DEPARTMENT Provider Note   CSN: [id number] Arrival date & time: 10/05/21  6967     History Chief Complaint  Patient presents with   Back Pain    Jon Rose is a 25 y.o. male who presents with Back pain. Pain is gripping and burning at times. Onset 3 days ago. No injuries. Pain is constant, progressively worsening. Pain begins in the mid back and wraps around both ribs, worse on the right.  It is worse when he twists and sometimes gripping and spastic.  He took Motrin with some improvement in his symptoms.  He does not do any heavy lifting.  He denies shortness of breath, urinary symptoms, bowel or bladder incontinence, lower extremity pain or weakness.   Back Pain     Past Medical History:  Diagnosis Date   Deviated septum     Patient Active Problem List   Diagnosis Date Noted   Chronic left shoulder pain 07/18/2017    Past Surgical History:  Procedure Laterality Date   left shoulder surgery         History reviewed. No pertinent family history.  Social History   Tobacco Use   Smoking status: Former    Packs/day: 0.10    Types: Cigarettes, Cigars   Smokeless tobacco: Never  Vaping Use   Vaping Use: Never used  Substance Use Topics   Alcohol use: No   Drug use: Yes    Types: Marijuana    Home Medications Prior to Admission medications   Medication Sig Start Date End Date Taking? Authorizing Provider  ondansetron (ZOFRAN) 8 MG tablet Take 1 tablet (8 mg total) by mouth every 8 (eight) hours as needed for nausea or vomiting. Patient not taking: Reported on 12/30/2020 07/12/20   Eustace Moore, MD    Allergies    Patient has no known allergies.  Review of Systems   Review of Systems  Musculoskeletal:  Positive for back pain.  Ten systems reviewed and are negative for acute change, except as noted in the HPI.   Physical Exam Updated Vital Signs BP 138/88 (BP Location: Right Arm)   Pulse 68   Temp 98 F  (36.7 C) (Oral)   Resp 18   Ht 5\' 8"  (1.727 m)   Wt 102.1 kg   SpO2 96%   BMI 34.21 kg/m   Physical Exam Vitals and nursing note reviewed.  Constitutional:      General: He is not in acute distress.    Appearance: He is well-developed. He is not diaphoretic.  HENT:     Head: Normocephalic and atraumatic.  Eyes:     General: No scleral icterus.    Conjunctiva/sclera: Conjunctivae normal.  Cardiovascular:     Rate and Rhythm: Normal rate and regular rhythm.     Heart sounds: Normal heart sounds.  Pulmonary:     Effort: Pulmonary effort is normal. No respiratory distress.     Breath sounds: Normal breath sounds.  Abdominal:     Palpations: Abdomen is soft.     Tenderness: There is no abdominal tenderness.  Musculoskeletal:     Cervical back: Normal range of motion and neck supple.       Back:     Comments: No midline spinal tenderness, tenderness along the bilateral erector spinae a and superior aspect of the latissimus muscle.  Range of motion limited with rotation, change in position, forward flexion.  Skin:    General: Skin is warm and dry.  Neurological:  General: No focal deficit present.     Mental Status: He is alert.  Psychiatric:        Behavior: Behavior normal.    ED Results / Procedures / Treatments   Labs (all labs ordered are listed, but only abnormal results are displayed) Labs Reviewed - No data to display  EKG None  Radiology No results found.  Procedures Procedures   Medications Ordered in ED Medications - No data to display  ED Course  I have reviewed the triage vital signs and the nursing notes.  Pertinent labs & imaging results that were available during my care of the patient were reviewed by me and considered in my medical decision making (see chart for details).    MDM Rules/Calculators/A&P  25 year old male here with complaints of back pain.  Patient appears to have muscle spasm.  He had no known injuries. Pain is  reproducible. No red flag sxs. Afebrile and HDS will d/c with robaxin and celebrex. Discussed supportive care osteoporosis f/u and return precautions. Final Clinical Impression(s) / ED Diagnoses Final diagnoses:  Muscle spasm of back    Rx / DC Orders ED Discharge Orders     None        Arthor Captain, PA-C 10/05/21 0739    Cathren Laine, MD 10/05/21 [phone]

## 2021-10-05 NOTE — ED Triage Notes (Signed)
Pt arrived POV from home c/o of mid to lower back pain that started on Sunday but has gradually gotten worse. Pt states it feels like it is tight and after long periods of sitting or standing cause it to burn. Pt states yesterday at work he started having pain on both sides of his upper rib cage as well. Pt states the pain is a 4/10 at this time but it is burning and worrying him. Pt states he normally has a high pain tolerance.

## 2021-10-05 NOTE — Discharge Instructions (Addendum)
SEEK IMMEDIATE MEDICAL ATTENTION IF: New numbness, tingling, weakness, or problem with the use of your arms or legs.  Severe back pain not relieved with medications.  Change in bowel or bladder control.  Increasing pain in any areas of the body (such as chest or abdominal pain).  Shortness of breath, dizziness or fainting.  Nausea (feeling sick to your stomach), vomiting, fever, or sweats.  

## 2021-10-07 ENCOUNTER — Emergency Department (HOSPITAL_COMMUNITY): Payer: Managed Care, Other (non HMO)

## 2021-10-07 ENCOUNTER — Emergency Department (HOSPITAL_COMMUNITY)
Admission: EM | Admit: 2021-10-07 | Discharge: 2021-10-07 | Disposition: A | Discharge status: Home / Self Care | Payer: Managed Care, Other (non HMO) | Attending: Emergency Medicine | Admitting: Emergency Medicine

## 2021-10-07 ENCOUNTER — Encounter (HOSPITAL_COMMUNITY): Payer: Self-pay

## 2021-10-07 ENCOUNTER — Other Ambulatory Visit: Payer: Self-pay

## 2021-10-07 DIAGNOSIS — Z87891 Personal history of nicotine dependence: Secondary | ICD-10-CM | POA: Insufficient documentation

## 2021-10-07 DIAGNOSIS — J101 Influenza due to other identified influenza virus with other respiratory manifestations: Secondary | ICD-10-CM | POA: Insufficient documentation

## 2021-10-07 DIAGNOSIS — Z20822 Contact with and (suspected) exposure to covid-19: Secondary | ICD-10-CM | POA: Insufficient documentation

## 2021-10-07 DIAGNOSIS — R059 Cough, unspecified: Secondary | ICD-10-CM | POA: Diagnosis present

## 2021-10-07 LAB — RESP PANEL BY RT-PCR (FLU A&B, COVID) ARPGX2
Influenza A by PCR: POSITIVE — AB
Influenza B by PCR: NEGATIVE
SARS Coronavirus 2 by RT PCR: NEGATIVE

## 2021-10-07 MED ORDER — BENZONATATE 100 MG PO CAPS: 100.0000 mg | ORAL_CAPSULE | Freq: Three times a day (TID) | ORAL | 0 refills | Status: DC

## 2021-10-07 MED ORDER — IPRATROPIUM-ALBUTEROL 0.5-2.5 (3) MG/3ML IN SOLN
3.0000 mL | Freq: Once | RESPIRATORY_TRACT | Status: AC
  Administered 2021-10-07: 3 mL via RESPIRATORY_TRACT
  Filled 2021-10-07: qty 3

## 2021-10-07 MED ORDER — OSELTAMIVIR PHOSPHATE 75 MG PO CAPS: 75.0000 mg | ORAL_CAPSULE | Freq: Two times a day (BID) | ORAL | 0 refills | Status: DC

## 2021-10-07 NOTE — ED Provider Notes (Signed)
[facility] EMERGENCY DEPARTMENT Provider Note   CSN: [id number] Arrival date & time: 10/07/21  2947     History Chief Complaint  Patient presents with   Shortness of Breath    Jon Rose is a 25 y.o. male with no pertinent past medical history presenting today with complaint of shortness of breath and productive cough that began last night.  Also endorsing chills, congestion and body aches.  Denies sick contacts.  Has not taken anything at home.  Was here earlier this week for back pain and was told to come back if he has shortness of breath, but he is "not sure if this is related."  No history of asthma.   Past Medical History:  Diagnosis Date   Deviated septum     Patient Active Problem List   Diagnosis Date Noted   Chronic left shoulder pain 07/18/2017    Past Surgical History:  Procedure Laterality Date   left shoulder surgery         No family history on file.  Social History   Tobacco Use   Smoking status: Former    Packs/day: 0.10    Types: Cigarettes, Cigars   Smokeless tobacco: Never  Vaping Use   Vaping Use: Never used  Substance Use Topics   Alcohol use: No   Drug use: Yes    Types: Marijuana    Home Medications Prior to Admission medications   Medication Sig Start Date End Date Taking? Authorizing Provider  celecoxib (CELEBREX) 200 MG capsule Take 1 capsule (200 mg total) by mouth 2 (two) times daily. 10/05/21   Arthor Captain, PA-C  methocarbamol (ROBAXIN) 500 MG tablet Take 1 tablet (500 mg total) by mouth 3 (three) times daily as needed for muscle spasms. 10/05/21   Harris, Abigail, PA-C  ondansetron (ZOFRAN) 8 MG tablet Take 1 tablet (8 mg total) by mouth every 8 (eight) hours as needed for nausea or vomiting. Patient not taking: Reported on 12/30/2020 07/12/20   Eustace Moore, MD    Allergies    Patient has no known allergies.  Review of Systems   Review of Systems  Constitutional:  Positive for chills.   HENT:  Positive for congestion. Negative for sore throat.   Respiratory:  Positive for cough and shortness of breath.   Cardiovascular:  Positive for chest pain.  Gastrointestinal:  Negative for diarrhea, nausea and vomiting.  Musculoskeletal:  Positive for myalgias.  Neurological:  Negative for headaches.  All other systems reviewed and are negative.  Physical Exam Updated Vital Signs BP (!) 135/91   Pulse 93   Temp 99.2 F (37.3 C) (Oral)   Resp (!) 27   Ht 5\' 8"  (1.727 m)   Wt 102.1 kg   SpO2 97%   BMI 34.21 kg/m   Physical Exam Vitals and nursing note reviewed.  Constitutional:      Appearance: Normal appearance.  HENT:     Head: Normocephalic and atraumatic.     Mouth/Throat:     Mouth: Mucous membranes are moist.     Pharynx: Oropharynx is clear.  Eyes:     General: No scleral icterus.    Conjunctiva/sclera: Conjunctivae normal.  Cardiovascular:     Rate and Rhythm: Normal rate and regular rhythm.  Pulmonary:     Effort: Pulmonary effort is normal. Tachypnea present. No respiratory distress.     Breath sounds: Examination of the right-upper field reveals wheezing. Examination of the left-upper field reveals wheezing. Wheezing present. No rhonchi  or rales.  Skin:    General: Skin is warm and dry.     Findings: No rash.  Neurological:     Mental Status: He is alert.  Psychiatric:        Mood and Affect: Mood normal.        Behavior: Behavior normal.    ED Results / Procedures / Treatments   Labs (all labs ordered are listed, but only abnormal results are displayed) Labs Reviewed  RESP PANEL BY RT-PCR (FLU A&B, COVID) [id number]    EKG EKG Interpretation  Date/Time:  Friday October 07 2021 07:20:43 EST Ventricular Rate:  95 PR Interval:  206 QRS Duration: 76 QT Interval:  314 QTC Calculation: 394 R Axis:   37 Text Interpretation: Normal sinus rhythm Normal ECG Confirmed by Benjiman Core [phone]) on 10/07/2021 7:48:25 AM  Radiology DG Chest 2  View  Result Date: 10/07/2021 CLINICAL DATA:  Shortness of breath, cough EXAM: CHEST - 2 VIEW COMPARISON:  08/23/2018 FINDINGS: The heart size and mediastinal contours are within normal limits. Both lungs are clear. The visualized skeletal structures are unremarkable. IMPRESSION: No acute abnormality of the lungs. Electronically Signed   By: Jearld Lesch M.D.   On: 10/07/2021 07:40    Procedures Procedures   Medications Ordered in ED Medications  ipratropium-albuterol (DUONEB) 0.5-2.5 (3) MG/3ML nebulizer solution 3 mL (has no administration in time range)    ED Course  I have reviewed the triage vital signs and the nursing notes.  Pertinent labs & imaging results that were available during my care of the patient were reviewed by me and considered in my medical decision making (see chart for details).    MDM Rules/Calculators/A&P Patient was fully evaluated by me, he was not in respiratory distress however he was audibly wheezing upon entrance to the room.  Chest x-ray normal.  Mildly elevated temperature.  Symptoms are very consistent with COVID/flu however he may also have some underlying asthma.  Viral testing revealed a positive flu a.  He was treated with a DuoNeb and will be discharged home with a work note, Tamiflu and Lawyer.  Final Clinical Impression(s) / ED Diagnoses Final diagnoses:  Influenza A    Rx / DC Orders Results and diagnoses were explained to the patient. Return precautions discussed in full. Patient had no additional questions and expressed complete understanding.   This chart was dictated using voice recognition software.  Despite best efforts to proofread,  errors can occur which can change the documentation meaning.    Woodroe Chen 10/07/21 0856    Benjiman Core, MD 10/08/21 [phone]

## 2021-10-07 NOTE — ED Triage Notes (Signed)
Pt reports sob that started last night while he was lying down, some chest pain and productive cough. +chills

## 2021-10-07 NOTE — Discharge Instructions (Addendum)
Please read the attached information about the flu.  You are contagious until you go 24 hours without a fever.  I have sent the medications that we discussed to your pharmacy.  Please take them as prescribed.  Return to the emergency department if you begin to have severe chest pain, difficulty breathing or overall worsening of your condition.

## 2021-10-12 ENCOUNTER — Encounter (HOSPITAL_COMMUNITY): Payer: Self-pay

## 2021-10-12 ENCOUNTER — Ambulatory Visit (HOSPITAL_COMMUNITY)
Admission: EM | Admit: 2021-10-12 | Discharge: 2021-10-12 | Disposition: A | Discharge status: Home / Self Care | Payer: Managed Care, Other (non HMO) | Attending: Urgent Care | Admitting: Urgent Care

## 2021-10-12 ENCOUNTER — Other Ambulatory Visit: Payer: Self-pay

## 2021-10-12 DIAGNOSIS — R062 Wheezing: Secondary | ICD-10-CM | POA: Diagnosis not present

## 2021-10-12 DIAGNOSIS — J101 Influenza due to other identified influenza virus with other respiratory manifestations: Secondary | ICD-10-CM

## 2021-10-12 DIAGNOSIS — R052 Subacute cough: Secondary | ICD-10-CM

## 2021-10-12 MED ORDER — ALBUTEROL SULFATE HFA 108 (90 BASE) MCG/ACT IN AERS: 1.0000 | INHALATION_SPRAY | Freq: Four times a day (QID) | RESPIRATORY_TRACT | 0 refills | Status: DC | PRN

## 2021-10-12 MED ORDER — PREDNISONE 20 MG PO TABS: ORAL_TABLET | ORAL | 0 refills | Status: DC

## 2021-10-12 NOTE — ED Provider Notes (Signed)
[facility]   MRN: [id number] DOB: 03-01-1996  Subjective:   Jon Rose is a 25 y.o. male presenting for recheck regarding coughing, wheezing and shortness of breath.  Patient was diagnosed with influenza on 10/07/2021.  He was prescribed Tamiflu, cough capsules.  Reports that he has been compliant with medications.  Admits that he is actually not feeling that sick but would like to get rid of his wheeze and breathe a little bit better.  Has never had a definitive diagnosis of asthma but has previously had to use an inhaler and steroid courses for his breathing.  He is primarily here because he would like to get a note for work as he plans on returning soon and they are requiring documentation.  No current facility-administered medications for this encounter.  Current Outpatient Medications:    albuterol (VENTOLIN HFA) 108 (90 Base) MCG/ACT inhaler, Inhale 1-2 puffs into the lungs every 6 (six) hours as needed for wheezing or shortness of breath., Disp: 18 g, Rfl: 0   predniSONE (DELTASONE) 20 MG tablet, Take 2 tablets daily with breakfast., Disp: 10 tablet, Rfl: 0   benzonatate (TESSALON) 100 MG capsule, Take 1 capsule (100 mg total) by mouth every 8 (eight) hours., Disp: 21 capsule, Rfl: 0   celecoxib (CELEBREX) 200 MG capsule, Take 1 capsule (200 mg total) by mouth 2 (two) times daily., Disp: 20 capsule, Rfl: 0   methocarbamol (ROBAXIN) 500 MG tablet, Take 1 tablet (500 mg total) by mouth 3 (three) times daily as needed for muscle spasms., Disp: 21 tablet, Rfl: 0   ondansetron (ZOFRAN) 8 MG tablet, Take 1 tablet (8 mg total) by mouth every 8 (eight) hours as needed for nausea or vomiting. (Patient not taking: Reported on 12/30/2020), Disp: 20 tablet, Rfl: 0   oseltamivir (TAMIFLU) 75 MG capsule, Take 1 capsule (75 mg total) by mouth every 12 (twelve) hours., Disp: 10 capsule, Rfl: 0   No Known Allergies  Past Medical History:  Diagnosis Date   Deviated septum       Past Surgical History:  Procedure Laterality Date   left shoulder surgery      Family History  Family history unknown: Yes    Social History   Tobacco Use   Smoking status: Former    Packs/day: 0.10    Types: Cigarettes, Cigars   Smokeless tobacco: Never  Vaping Use   Vaping Use: Never used  Substance Use Topics   Alcohol use: No   Drug use: Yes    Types: Marijuana    ROS   Objective:   Vitals: BP 120/63 (BP Location: Left Arm)   Pulse (!) 54   Temp 97.9 F (36.6 C) (Oral)   Resp 17   SpO2 98%   Physical Exam Constitutional:      General: He is not in acute distress.    Appearance: Normal appearance. He is well-developed and normal weight. He is not ill-appearing, toxic-appearing or diaphoretic.  HENT:     Head: Normocephalic and atraumatic.     Right Ear: External ear normal.     Left Ear: External ear normal.     Nose: Nose normal.     Mouth/Throat:     Mouth: Mucous membranes are moist.     Comments: Audible wheezing through the pharynx.  No signs of epiglottitis, stridor. Eyes:     General: No scleral icterus.       Right eye: No discharge.  Left eye: No discharge.     Extraocular Movements: Extraocular movements intact.     Pupils: Pupils are equal, round, and reactive to light.  Cardiovascular:     Rate and Rhythm: Normal rate and regular rhythm.     Heart sounds: Normal heart sounds. No murmur heard.   No friction rub. No gallop.  Pulmonary:     Effort: Pulmonary effort is normal. No respiratory distress.     Breath sounds: Normal breath sounds. No stridor. No wheezing, rhonchi or rales.  Musculoskeletal:     Cervical back: Normal range of motion.  Skin:    General: Skin is warm and dry.  Neurological:     Mental Status: He is alert and oriented to person, place, and time.  Psychiatric:        Mood and Affect: Mood normal.        Behavior: Behavior normal.        Thought Content: Thought content normal.        Judgment: Judgment  normal.    Assessment and Plan :   PDMP not reviewed this encounter.  1. Wheezing   2. Influenza A   3. Subacute cough     At the end of the visit, patient was no longer wheezing.  Suspect that this is laryngeal, still viral given his positive flu test.  Offered him steroids which she has done well with in the past.  Recommend supportive care, refilled albuterol. Counseled patient on potential for adverse effects with medications prescribed/recommended today, ER and return-to-clinic precautions discussed, patient verbalized understanding.    Wallis Bamberg, PA-C 10/12/21 1225

## 2021-10-12 NOTE — ED Triage Notes (Signed)
Pt presents with ongoing non productive cough, wheezing, and shortness of breath X 6 days after being diagnosed with the flu.

## 2022-02-06 ENCOUNTER — Encounter (HOSPITAL_COMMUNITY): Payer: Self-pay

## 2022-02-06 ENCOUNTER — Ambulatory Visit (HOSPITAL_COMMUNITY)
Admission: RE | Admit: 2022-02-06 | Discharge: 2022-02-06 | Disposition: A | Discharge status: Home / Self Care | Payer: Managed Care, Other (non HMO) | Source: Ambulatory Visit

## 2022-02-06 ENCOUNTER — Other Ambulatory Visit: Payer: Self-pay

## 2022-02-06 VITALS — BP 134/77 | HR 90 | Temp 98.2°F | Resp 14

## 2022-02-06 DIAGNOSIS — R194 Change in bowel habit: Secondary | ICD-10-CM

## 2022-02-06 NOTE — Discharge Instructions (Addendum)
As discussed, you will need a PCP to provide work accommodations for your job. ?You can also contact community health and wellness with the number you have been provided. ?Increase the amount of fiber in your diet. ?Follow-up as needed. ? ?

## 2022-02-06 NOTE — ED Triage Notes (Signed)
Pt presents with request for a work accomodation for frequent bathroom use. Pt states he has had multiple BM's today. ?

## 2022-02-06 NOTE — ED Provider Notes (Signed)
?[facility] ? ? ? ?CSN: [id number] ?Arrival date & time: 02/06/22  1455 ? ? ?  ? ?History   ?Chief Complaint ?Chief Complaint  ?Patient presents with  ? Letter for School/Work  ? ? ?HPI ?Jon Rose is a 26 y.o. male.  ? ?The patient is a 26 year old male who presents for a work note for his frequent bowel movements and urination.  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  Patient states that he was informed that he was monitored at work and was told that he use the restroom 4 times while on his shift.  The patient states that he does have approximately 5-6 bowel movements per day.  He states that he also experiences pain in his abdomen during the nighttime, and he has to wake up and have a bowel movement.  The patient states that his family has also told him that he uses the bathroom quite frequently, but he states he has never had any issues.  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  He states since he has stopped, he has gained approximately 80 pounds and was wondering if this had something to do with his bowel and toileting habits.  He currently does not have a PCP.  He denies any fever, chills, urinary symptoms or GI symptoms. ? ?The history is provided by the patient.  ? ?Past Medical History:  ?Diagnosis Date  ? Deviated septum   ? ? ?Patient Active Problem List  ? Diagnosis Date Noted  ? Chronic left shoulder pain 07/18/2017  ? ? ?Past Surgical History:  ?Procedure Laterality Date  ? left shoulder surgery    ? ? ? ? ? ?Home Medications   ? ?Prior to Admission medications   ?Medication Sig Start Date End Date Taking? Authorizing Provider  ?albuterol (VENTOLIN HFA) 108 (90 Base) MCG/ACT inhaler Inhale 1-2 puffs into the lungs every 6 (six) hours as needed for wheezing or shortness of breath. ?Patient not taking: Reported on 02/06/2022 10/12/21   Wallis BambergMani, Mario, PA-C   ?benzonatate (TESSALON) 100 MG capsule Take 1 capsule (100 mg total) by mouth every 8 (eight) hours. ?Patient not taking: Reported on 02/06/2022 10/07/21   Redwine, Madison A, PA-C  ?celecoxib (CELEBREX) 200 MG capsule Take 1 capsule (200 mg total) by mouth 2 (two) times daily. ?Patient not taking: Reported on 02/06/2022 10/05/21   Arthor CaptainHarris, Abigail, PA-C  ?methocarbamol (ROBAXIN) 500 MG tablet Take 1 tablet (500 mg total) by mouth 3 (three) times daily as needed for muscle spasms. ?Patient not taking: Reported on 02/06/2022 10/05/21   Arthor CaptainHarris, Abigail, PA-C  ?ondansetron (ZOFRAN) 8 MG tablet Take 1 tablet (8 mg total) by mouth every 8 (eight) hours as needed for nausea or vomiting. ?Patient not taking: Reported on 12/30/2020 07/12/20   Eustace MooreNelson, Yvonne Sue, MD  ?oseltamivir (TAMIFLU) 75 MG capsule Take 1 capsule (75 mg total) by mouth every 12 (twelve) hours. ?Patient not taking: Reported on 02/06/2022 10/07/21   Redwine, Madison A, PA-C  ?predniSONE (DELTASONE) 20 MG tablet Take 2 tablets daily with breakfast. ?Patient not taking: Reported on 02/06/2022 10/12/21   Wallis BambergMani, Mario, PA-C  ? ? ?Family History ?Family History  ?Family history unknown: Yes  ? ? ?Social History ?Social History  ? ?Tobacco Use  ? Smoking status: Former  ?  Packs/day: 0.10  ?  Types: Cigarettes, Cigars  ?  Smokeless tobacco: Never  ?Vaping Use  ? Vaping Use: Never used  ?Substance Use Topics  ? Alcohol use: No  ? Drug use: Yes  ?  Types: Marijuana  ? ? ? ?Allergies   ?Patient has no known allergies. ? ? ?Review of Systems ?Review of Systems  ?Constitutional: Negative.   ?Gastrointestinal: Negative.   ?Skin: Negative.   ?Psychiatric/Behavioral: Negative.    ? ? ?Physical Exam ?Triage Vital Signs ?ED Triage Vitals  ?Enc Vitals Group  ?   BP 02/06/22 1532 134/77  ?   Pulse Rate 02/06/22 1532 90  ?   Resp 02/06/22 1532 14  ?   Temp 02/06/22 1532 98.2 ?F (36.8 ?C)  ?   Temp Source 02/06/22 1532 Oral  ?   SpO2 02/06/22 1532 99 %  ?   Weight --   ?   Height --    ?   Head Circumference --   ?   Peak Flow --   ?   Pain Score 02/06/22 1535 0  ?   Pain Loc --   ?   Pain Edu? --   ?   Excl. in GC? --   ? ?No data found. ? ?Updated Vital Signs ?BP 134/77 (BP Location: Left Arm)   Pulse 90   Temp 98.2 ?F (36.8 ?C) (Oral)   Resp 14   SpO2 99%  ? ?Visual Acuity ?Right Eye Distance:   ?Left Eye Distance:   ?Bilateral Distance:   ? ?Right Eye Near:   ?Left Eye Near:    ?Bilateral Near:    ? ?Physical Exam ?Vitals reviewed.  ?Constitutional:   ?   General: He is not in acute distress. ?   Appearance: Normal appearance.  ?HENT:  ?   Head: Normocephalic and atraumatic.  ?Cardiovascular:  ?   Rate and Rhythm: Normal rate and regular rhythm.  ?Pulmonary:  ?   Effort: Pulmonary effort is normal.  ?   Breath sounds: Normal breath sounds.  ?Abdominal:  ?   General: Bowel sounds are normal.  ?   Palpations: Abdomen is soft.  ?Skin: ?   General: Skin is warm and dry.  ?Neurological:  ?   Mental Status: He is alert and oriented to person, place, and time.  ?Psychiatric:     ?   Mood and Affect: Mood normal.     ?   Behavior: Behavior normal.  ? ? ? ?UC Treatments / Results  ?Labs ?(all labs ordered are listed, but only abnormal results are displayed) ?Labs Reviewed - No data to display ? ?EKG ? ? ?Radiology ?No results found. ? ?Procedures ?Procedures (including critical care time) ? ?Medications Ordered in UC ?Medications - No data to display ? ?Initial Impression / Assessment and Plan / UC Course  ?I have reviewed the triage vital signs and the nursing notes. ? ?Pertinent labs & imaging results that were available during my care of the patient were reviewed by me and considered in my medical decision making (see chart for details). ? ?The patient is a 26 year old male who presents for a work note for bowel frequency.  Patient states this has been an ongoing symptom for as long as he can remember.  He states that he is not bothered by this, but his family has informed him that he does have  frequent bowel movements.  He was sent over by his job for a work accommodation for his bowel frequency.  Patient informed that this cannot be provided at  this office.  He will need to follow-up with a primary care physician for this request.  Discussed with patient that he may have IBS, and there are medications to help with those symptoms.  Informed that this is something that will need to be prescribed by a PCP.  We will offer the patient PCP assistance to help him find a provider.  He also has the number for community health and wellness and he will contact on his own. ?Final Clinical Impressions(s) / UC Diagnoses  ? ?Final diagnoses:  ?None  ? ?Discharge Instructions   ?None ?  ? ?ED Prescriptions   ?None ?  ? ?PDMP not reviewed this encounter. ?  ?Abran Cantor, NP ?02/06/22 1629 ? ?

## 2022-03-06 ENCOUNTER — Encounter (HOSPITAL_BASED_OUTPATIENT_CLINIC_OR_DEPARTMENT_OTHER): Payer: Self-pay | Admitting: Nurse Practitioner

## 2022-03-06 ENCOUNTER — Ambulatory Visit (INDEPENDENT_AMBULATORY_CARE_PROVIDER_SITE_OTHER): Payer: Managed Care, Other (non HMO) | Admitting: Nurse Practitioner

## 2022-03-06 VITALS — BP 130/100 | HR 69 | Ht 68.0 in | Wt 249.0 lb

## 2022-03-06 DIAGNOSIS — G4486 Cervicogenic headache: Secondary | ICD-10-CM

## 2022-03-06 DIAGNOSIS — R350 Frequency of micturition: Secondary | ICD-10-CM

## 2022-03-06 DIAGNOSIS — R1114 Bilious vomiting: Secondary | ICD-10-CM | POA: Diagnosis not present

## 2022-03-06 DIAGNOSIS — R030 Elevated blood-pressure reading, without diagnosis of hypertension: Secondary | ICD-10-CM

## 2022-03-06 DIAGNOSIS — R197 Diarrhea, unspecified: Secondary | ICD-10-CM

## 2022-03-06 DIAGNOSIS — M542 Cervicalgia: Secondary | ICD-10-CM

## 2022-03-06 DIAGNOSIS — R072 Precordial pain: Secondary | ICD-10-CM | POA: Insufficient documentation

## 2022-03-06 DIAGNOSIS — R519 Headache, unspecified: Secondary | ICD-10-CM | POA: Insufficient documentation

## 2022-03-06 DIAGNOSIS — M25519 Pain in unspecified shoulder: Secondary | ICD-10-CM

## 2022-03-06 HISTORY — DX: Headache, unspecified: R51.9

## 2022-03-06 LAB — POCT URINALYSIS DIP (CLINITEK)
Bilirubin, UA: NEGATIVE
Blood, UA: NEGATIVE
Glucose, UA: NEGATIVE mg/dL
Ketones, POC UA: NEGATIVE mg/dL
Leukocytes, UA: NEGATIVE
Nitrite, UA: NEGATIVE
POC PROTEIN,UA: NEGATIVE
Spec Grav, UA: 1.025 (ref 1.010–1.025)
Urobilinogen, UA: 0.2 E.U./dL
pH, UA: 7 (ref 5.0–8.0)

## 2022-03-06 MED ORDER — ONDANSETRON HCL 8 MG PO TABS: 8.0000 mg | ORAL_TABLET | Freq: Three times a day (TID) | ORAL | 0 refills | Status: DC | PRN

## 2022-03-06 NOTE — Assessment & Plan Note (Signed)
Left frontal HA with no alarm sx present. No signs of neurologic impairment present at this time. Suspect related to viral etiology contributing to GI distress at this time. Will monitor closely. Recommend at home BP check to ensure that BP is not staying elevated at home and increased hydration in the setting of GI illness.  ?

## 2022-03-06 NOTE — Assessment & Plan Note (Addendum)
BP elevated in office today with nausea, vomiting, and headache. Unclear if elevation is related to illness or undiagnosed chronic issue. Recommend at home monitoring daily for at least the next 2 weeks and will plan phone follow-up to discuss readings and recommendations.  ?HA is present today, but he is also exhibiting symptoms of viral gastroenteritis. No alarm symptoms present. No focal deficit or weakness. Recommend going home today, resting, and increasing hydration.  ?Will obtain labs today and make changes to plan of care based on findings.  ?

## 2022-03-06 NOTE — Patient Instructions (Addendum)
Thank you for choosing [place] at [facility] for your Primary Care needs. I am excited for the opportunity to partner with you to meet your health care goals. It was a pleasure meeting you today! ? ?Recommendations from today's visit: ?We will check your urine today to make sure there are no signs of infection present.  ?We will also check basic labs to make sure that you don't have any other concerning findings.  ?If there is anything concerning on the labs, we will be in touch with you.  ?I would like for you to check your blood pressure at home once a day in the mornings when you are resting and calm and write these down for me so we can see if they are staying high at home or if the elevation today was just related to being sick today ? ? ?Information on diet, exercise, and health maintenance recommendations are listed below. This is information to help you be sure you are on track for optimal health and monitoring.  ? ?Please look over this and let Jon Rose know if you have any questions or if you have completed any of the health maintenance outside of [facility] so that we can be sure your records are up to date.  ?___________________________________________________________ ?About Me: ?I am an Adult-Geriatric Nurse Practitioner with a background in caring for patients for more than 20 years with a strong intensive care background. I provide primary care and sports medicine services to patients age 80 and older within this office. My education had a strong focus on caring for the older adult population, which I am passionate about. I am also the director of the APP Fellowship with [facility].  ? ?My desire is to provide you with the best service through preventive medicine and supportive care. I consider you a part of the medical team and value your input. I work diligently to ensure that you are heard and your needs are met in a safe and effective manner. I want you to feel comfortable  with me as your provider and want you to know that your health concerns are important to me. ? ?For your information, our office hours are: ?Monday, Tuesday, and Thursday 8:00 AM - 5:00 PM ?Wednesday and Friday 8:00 AM - 12:00 PM.  ? ?In my time away from the office I am teaching new APP's within the system and am unavailable, but my partner, Dr. Burnard Bunting is in the office for emergent needs.  ? ?If you have questions or concerns, please call our office at [phone] or send Jon Rose a MyChart message and we will respond as quickly as possible.  ?____________________________________________________________ ?MyChart:  ?For all urgent or time sensitive needs we ask that you please call the office to avoid delays. Our number is [phone]. ?MyChart is not constantly monitored and due to the large volume of messages a day, replies may take up to 72 business hours. ? ?MyChart Policy: ?MyChart allows for you to see your visit notes, after visit summary, provider recommendations, lab and tests results, make an appointment, request refills, and contact your provider or the office for non-urgent questions or concerns. Providers are seeing patients during normal business hours and do not have built in time to review MyChart messages.  ?We ask that you allow a minimum of 3 business days for responses to [employer]. For this reason, please do not send urgent requests through [facility]. Please call the office at [phone]. ?New and ongoing conditions may  require a visit. We have virtual and in person visit available for your convenience.  ?Complex MyChart concerns may require a visit. Your provider may request you schedule a virtual or in person visit to ensure we are providing the best care possible. ?MyChart messages sent after 11:00 AM on Friday will not be received by the provider until Monday morning.  ?  ?Lab and Test Results: ?You will receive your lab and test results on MyChart as soon as they are completed and  results have been sent by the lab or testing facility. Due to this service, you will receive your results BEFORE your provider.  ?I review lab and tests results each morning prior to seeing patients. Some results require collaboration with other providers to ensure you are receiving the most appropriate care. For this reason, we ask that you please allow a minimum of 3-5 business days from the time the ALL results have been received for your provider to receive and review lab and test results and contact you about these.  ?Most lab and test result comments from the provider will be sent through [facility]. Your provider may recommend changes to the plan of care, follow-up visits, repeat testing, ask questions, or request an office visit to discuss these results. You may reply directly to this message or call the office at [phone] to provide information for the provider or set up an appointment. ?In some instances, you will be called with test results and recommendations. Please let Jon Rose know if this is preferred and we will make note of this in your chart to provide this for you.    ?If you have not heard a response to your lab or test results in 5 business days from all results returning to [place], please call the office to let Jon Rose know. We ask that you please avoid calling prior to this time unless there is an emergent concern. Due to high call volumes, this can delay the resulting process. ? ?After Hours: ?For all non-emergency after hours needs, please call the office at [phone] and select the option to reach the on-call provider service. On-call services are shared between multiple [place] offices and therefore it will not be possible to speak directly with your provider. On-call providers may provide medical advice and recommendations, but are unable to provide refills for maintenance medications.  ?For all emergency or urgent medical needs after normal business hours, we recommend that you seek care  at the closest Urgent Care or Emergency Department to ensure appropriate treatment in a timely manner.  ?[facility] has a 24 hour emergency room located on the ground floor for your convenience.  ? ?Urgent Concerns During the Business Day ?Providers are seeing patients from 8AM to [place] with a busy schedule and are most often not able to respond to non-urgent calls until the end of the day or the next business day. ?If you should have URGENT concerns during the day, please call and speak to the nurse or schedule a same day appointment so that we can address your concern without delay.  ? ?Thank you, again, for choosing me as your health care partner. I appreciate your trust and look forward to learning more about you.  ? ?Worthy Keeler, DNP, AGNP-c ?___________________________________________________________ ? ?Health Maintenance Recommendations ?Screening Testing ?Mammogram ?Every 1 -2 years based on history and risk factors ?Starting at age 54 ?Pap Smear ?Ages 21-39 every 3 years ?Ages 53-65 every 5 years with HPV testing ?More frequent testing  may be required based on results and history ?Colon Cancer Screening ?Every 1-10 years based on test performed, risk factors, and history ?Starting at age 68 ?Bone Density Screening ?Every 2-10 years based on history ?Starting at age 43 for women ?Recommendations for men differ based on medication usage, history, and risk factors ?AAA Screening ?One time ultrasound ?Men 22-65 years old who have every smoked ?Lung Cancer Screening ?Low Dose Lung CT every 12 months ?Age 37-80 years with a 30 pack-year smoking history who still smoke or who have quit within the last 15 years ? ?Screening Labs ?Routine  Labs: Complete Blood Count (CBC), Complete Metabolic Panel (CMP), Cholesterol (Lipid Panel) ?Every 6-12 months based on history and medications ?May be recommended more frequently based on current conditions or previous results ?Hemoglobin A1c Lab ?Every  3-12 months based on history and previous results ?Starting at age 103 or earlier with diagnosis of diabetes, high cholesterol, BMI >26, and/or risk factors ?Frequent monitoring for patients with diabetes to e

## 2022-03-06 NOTE — Assessment & Plan Note (Addendum)
Onset this morning. Epigastric tenderness present. Patient denies frequent NSAID use or GERD symptoms. Given the current symptoms today, likely related to viral gastroenteritis. Recommend PRN zofran for current symptoms and increased rest and hydration. Monitor for worsening symptoms. Patient provided with note for work today.  ?

## 2022-03-06 NOTE — Assessment & Plan Note (Addendum)
Etiology unknown. Chronic. UA negative today. Will obtain GC/Ch and PSA as well as labs for DM today. No alarm sx present at this time.  ?Will plan follow-up based on findings of labs. ?Letter provided for work to allow accommodations for increased restroom use.  ?

## 2022-03-06 NOTE — Progress Notes (Signed)
? ?New Patient Office Visit ? ?Subjective   ? ?Patient ID: Jon Rose, male    DOB: 25-Dec-1995  Age: 26 y.o. MRN: [id number] ? ?CC:  ?Chief Complaint  ?Patient presents with  ? Establish Care  ? ? ?HPI ?Jon Rose presents to establish care. He also expresses concerns with the following issues today: ?Frequent urination and stool ?He requests work accommodations to allow for him to use the bathroom more often ?He reports 5-8 BM a day with mostly loose stools ?Urinates with each BM and other times throughout the day ?He reports he drinks plenty of water, but he does feel he gets dehydrated easily ?He tells me that his urine is not as clear as it used to be ?He tells me that as long as he remembers he has had frequent stools ?He tells me when he was wrestling he would loose 40-50 lbs a season, he occasionally did this with fasting, stool softeners, and dehydration.  ?He denies dysuria, hematuria, slow stream. ?He denies blood in stool. No nausea on general basis ? ?BP ?Elevated today ?Has not been to the doctor in about 9 years ?Headache today- occipital pain is daily, but the frontal pain is present today and not usually ?No vision changes, dizziness, CP ? ?Feeling Unwell ?Woke this morning with cough, nausea, vomiting ?This is not typical for him ? ?Neck pain/back pain ?For years ?Does not take anything to help with this ?Mostly has occipital pain on a daily basis ?Works on a computer ?History of shoulder injury with wrestling and since this time has experienced the neck pain and HA ?Limited ROM in left shouder ?Had PT at time of surgery, but did not complete the full session of this ? ?Sternal Chest Pain ?He reports pain when pushing himself up or when he is leaning forward ?He denies any pain when leaning back ?He reports this is a sharp sudden pain, but goes away immediately located at the sternum ?No N/V, dizziness, sweating, epigastric pain associated.  ? ?Outpatient Encounter Medications as of 03/06/2022   ?Medication Sig  ? ondansetron (ZOFRAN) 8 MG tablet Take 1 tablet (8 mg total) by mouth every 8 (eight) hours as needed for nausea or vomiting.  ? [DISCONTINUED] albuterol (VENTOLIN HFA) 108 (90 Base) MCG/ACT inhaler Inhale 1-2 puffs into the lungs every 6 (six) hours as needed for wheezing or shortness of breath. (Patient not taking: Reported on 02/06/2022)  ? [DISCONTINUED] benzonatate (TESSALON) 100 MG capsule Take 1 capsule (100 mg total) by mouth every 8 (eight) hours. (Patient not taking: Reported on 02/06/2022)  ? [DISCONTINUED] celecoxib (CELEBREX) 200 MG capsule Take 1 capsule (200 mg total) by mouth 2 (two) times daily. (Patient not taking: Reported on 02/06/2022)  ? [DISCONTINUED] methocarbamol (ROBAXIN) 500 MG tablet Take 1 tablet (500 mg total) by mouth 3 (three) times daily as needed for muscle spasms. (Patient not taking: Reported on 02/06/2022)  ? [DISCONTINUED] ondansetron (ZOFRAN) 8 MG tablet Take 1 tablet (8 mg total) by mouth every 8 (eight) hours as needed for nausea or vomiting. (Patient not taking: Reported on 12/30/2020)  ? [DISCONTINUED] oseltamivir (TAMIFLU) 75 MG capsule Take 1 capsule (75 mg total) by mouth every 12 (twelve) hours. (Patient not taking: Reported on 02/06/2022)  ? [DISCONTINUED] predniSONE (DELTASONE) 20 MG tablet Take 2 tablets daily with breakfast. (Patient not taking: Reported on 02/06/2022)  ? ?No facility-administered encounter medications on file as of 03/06/2022.  ? ? ?Past Medical History:  ?Diagnosis Date  ? Deviated septum   ? ? ?  Past Surgical History:  ?Procedure Laterality Date  ? left shoulder surgery    ? ? ?Family History  ?Family history unknown: Yes  ? ? ?Social History  ? ?Socioeconomic History  ? Marital status: Single  ?  Spouse name: Not on file  ? Number of children: Not on file  ? Years of education: Not on file  ? Highest education level: Not on file  ?Occupational History  ? Not on file  ?Tobacco Use  ? Smoking status: Former  ?  Packs/day: 0.10  ?  Types:  Cigarettes, Cigars  ? Smokeless tobacco: Never  ?Vaping Use  ? Vaping Use: Never used  ?Substance and Sexual Activity  ? Alcohol use: No  ? Drug use: Yes  ?  Types: Marijuana  ? Sexual activity: Never  ?Other Topics Concern  ? Not on file  ?Social History Narrative  ? Not on file  ? ?Social Determinants of Health  ? ?Financial Resource Strain: Not on file  ?Food Insecurity: Not on file  ?Transportation Needs: Not on file  ?Physical Activity: Not on file  ?Stress: Not on file  ?Social Connections: Not on file  ?Intimate Partner Violence: Not on file  ? ? ?Review of Systems  ?Constitutional:  Negative for chills, diaphoresis, fever, malaise/fatigue and weight loss.  ?Respiratory:  Positive for cough. Negative for shortness of breath and wheezing.   ?Cardiovascular:  Negative for palpitations, orthopnea, leg swelling and PND.  ?Gastrointestinal:  Positive for diarrhea, nausea and vomiting. Negative for abdominal pain, blood in stool, constipation and heartburn.  ?Genitourinary:  Positive for frequency and urgency. Negative for dysuria, flank pain and hematuria.  ?Musculoskeletal:  Positive for back pain, myalgias and neck pain. Negative for falls and joint pain.  ?Skin:  Negative for rash.  ?Neurological:  Positive for headaches. Negative for dizziness, tingling, focal weakness, seizures, loss of consciousness and weakness.  ?Endo/Heme/Allergies:  Does not bruise/bleed easily.  ?Psychiatric/Behavioral:  Negative for depression and substance abuse. The patient is not nervous/anxious.   ? ?  ? ? ?Objective   ? ?BP (!) 130/100   Pulse 69   Ht 5\' 8"  (1.727 m)   Wt 249 lb (112.9 kg)   SpO2 96%   BMI 37.86 kg/m?  ? ?Physical Exam ?Vitals and nursing note reviewed.  ?Constitutional:   ?   Appearance: Normal appearance. He is obese. He is ill-appearing.  ?HENT:  ?   Head: Normocephalic.  ?Eyes:  ?   Extraocular Movements: Extraocular movements intact.  ?   Conjunctiva/sclera: Conjunctivae normal.  ?   Pupils: Pupils are  equal, round, and reactive to light.  ?Neck:  ?   Vascular: No carotid bruit.  ?Cardiovascular:  ?   Rate and Rhythm: Normal rate and regular rhythm.  ?   Pulses: Normal pulses.  ?   Heart sounds: Normal heart sounds.  ?Pulmonary:  ?   Effort: Pulmonary effort is normal.  ?   Breath sounds: Normal breath sounds. No wheezing.  ?Abdominal:  ?   General: Bowel sounds are normal. There is no distension.  ?   Palpations: Abdomen is soft.  ?   Tenderness: There is abdominal tenderness in the epigastric area. There is no right CVA tenderness, left CVA tenderness or guarding.  ?Musculoskeletal:     ?   General: Tenderness present.  ?   Cervical back: Tenderness present.  ?   Right lower leg: No edema.  ?   Left lower leg: No edema.  ?  Comments: Decreased ROM of left shoulder to 90 degrees abduction with no weakness noted.  ?Tenderness to costosternal border with deep palpation. Lungs clear.   ?Lymphadenopathy:  ?   Cervical: No cervical adenopathy.  ?Skin: ?   General: Skin is warm and dry.  ?   Capillary Refill: Capillary refill takes less than 2 seconds.  ?Neurological:  ?   General: No focal deficit present.  ?   Mental Status: He is alert and oriented to person, place, and time.  ?   Motor: No weakness.  ?   Gait: Gait normal.  ? ?  ? ?Assessment & Plan:  ? ?Problem List Items Addressed This Visit   ? ? Frequent urination  ?  Etiology unknown. Chronic. UA negative today. Will obtain GC/Ch and PSA as well as labs for DM today. No alarm sx present at this time.  ?Will plan follow-up based on findings of labs. ?Letter provided for work to allow accommodations for increased restroom use.  ? ?  ?  ? Relevant Orders  ? CBC with Differential/Platelet  ? Comprehensive metabolic panel  ? Lipid panel  ? Hemoglobin A1c  ? TSH  ? Ct, Ng, Mycoplasmas NAA, Urine  ? PSA Total (Reflex To Free)  ? POCT URINALYSIS DIP (CLINITEK)  ? Urine Culture  ? Frequent diarrhea - Primary  ?  Frequent loose stools without n/v typically. Abdominal  pain and cramping relieved with BM. Epigastric tenderness today likely related to viral gastroenteritis.  ?Will obtain labs today for further evaluation and send referral to GI for suspected IBS.  ?Note prov

## 2022-03-06 NOTE — Assessment & Plan Note (Addendum)
Frequent loose stools without n/v typically. Abdominal pain and cramping relieved with BM. Epigastric tenderness today likely related to viral gastroenteritis.  ?Will obtain labs today for further evaluation and send referral to GI for suspected IBS.  ?Note provided for accommodations for employer to allow for increased restroom breaks. Will follow-up if sx worsen or new sx present. No alarm sx present today.  ?

## 2022-03-06 NOTE — Assessment & Plan Note (Signed)
Symptoms consistent with pain related to cervical muscle strain/tension. Discussed option of PT with patient to work on posture and ROM, but he declines at this time.  ?Handouts provided for at home stretching exercises for neck and shoulders to help with pain.  ?Recommend PRN NSAIDs to help with pain and inflammation. If no improvement will consider Ortho referral.  ?

## 2022-03-06 NOTE — Assessment & Plan Note (Signed)
Symptoms appear MSK in nature with sharp, short lasting pain with pressure applied to UE's. No cardiac alarm sx present at this time. Suspect costochondral pain. Recommend gentle stretching after occurrence and strengthening exercises to help prevent overexertion of muscles in the chest wall. Will monitor  ?

## 2022-03-06 NOTE — Assessment & Plan Note (Signed)
Chronic. Previous injury to shoulder with residual decreased ROM. Recommend PT services, however, patient declines at this time. Patient provided with handout for PT exercises for neck and shoulder that he can perform on his own. If this does not help with improved ROM or pain, will consider ortho referral if patient is OK with that. F/U if sx worsen or fail to improve ?

## 2022-03-07 LAB — COMPREHENSIVE METABOLIC PANEL
ALT: 34 IU/L (ref 0–44)
AST: 21 IU/L (ref 0–40)
Albumin/Globulin Ratio: 1.6 (ref 1.2–2.2)
Albumin: 4.3 g/dL (ref 4.1–5.2)
Alkaline Phosphatase: 95 IU/L (ref 44–121)
BUN/Creatinine Ratio: 10 (ref 9–20)
BUN: 8 mg/dL (ref 6–20)
Bilirubin Total: 0.3 mg/dL (ref 0.0–1.2)
CO2: 24 mmol/L (ref 20–29)
Calcium: 9.4 mg/dL (ref 8.7–10.2)
Chloride: 103 mmol/L (ref 96–106)
Creatinine, Ser: 0.77 mg/dL (ref 0.76–1.27)
Globulin, Total: 2.7 g/dL (ref 1.5–4.5)
Glucose: 93 mg/dL (ref 70–99)
Potassium: 4.4 mmol/L (ref 3.5–5.2)
Sodium: 140 mmol/L (ref 134–144)
Total Protein: 7 g/dL (ref 6.0–8.5)
eGFR: 127 mL/min/{1.73_m2} (ref >59)

## 2022-03-07 LAB — CBC WITH DIFFERENTIAL/PLATELET
Basophils Absolute: 0 10*3/uL (ref 0.0–0.2)
Basos: 1 %
EOS (ABSOLUTE): 0.2 10*3/uL (ref 0.0–0.4)
Eos: 3 %
Hematocrit: 46.2 % (ref 37.5–51.0)
Hemoglobin: 15.9 g/dL (ref 13.0–17.7)
Immature Grans (Abs): 0 10*3/uL (ref 0.0–0.1)
Immature Granulocytes: 0 %
Lymphocytes Absolute: 2.2 10*3/uL (ref 0.7–3.1)
Lymphs: 31 %
MCH: 29.9 pg (ref 26.6–33.0)
MCHC: 34.4 g/dL (ref 31.5–35.7)
MCV: 87 fL (ref 79–97)
Monocytes Absolute: 0.6 10*3/uL (ref 0.1–0.9)
Monocytes: 8 %
Neutrophils Absolute: 4.2 10*3/uL (ref 1.4–7.0)
Neutrophils: 57 %
Platelets: 301 10*3/uL (ref 150–450)
RBC: 5.31 x10E6/uL (ref 4.14–5.80)
RDW: 12.4 % (ref 11.6–15.4)
WBC: 7.2 10*3/uL (ref 3.4–10.8)

## 2022-03-07 LAB — LIPID PANEL
Chol/HDL Ratio: 4.8 ratio (ref 0.0–5.0)
Cholesterol, Total: 184 mg/dL (ref 100–199)
HDL: 38 mg/dL — ABNORMAL LOW (ref >39)
LDL Chol Calc (NIH): 110 mg/dL — ABNORMAL HIGH (ref 0–99)
Triglycerides: 204 mg/dL — ABNORMAL HIGH (ref 0–149)
VLDL Cholesterol Cal: 36 mg/dL (ref 5–40)

## 2022-03-07 LAB — TSH: TSH: 2.22 u[IU]/mL (ref 0.450–4.500)

## 2022-03-07 LAB — C-REACTIVE PROTEIN: CRP: 4 mg/L (ref 0–10)

## 2022-03-07 LAB — HEMOGLOBIN A1C
Est. average glucose Bld gHb Est-mCnc: 105 mg/dL
Hgb A1c MFr Bld: 5.3 % (ref 4.8–5.6)

## 2022-03-07 LAB — PSA TOTAL (REFLEX TO FREE): Prostate Specific Ag, Serum: 1.3 ng/mL (ref 0.0–4.0)

## 2022-03-08 ENCOUNTER — Encounter (HOSPITAL_BASED_OUTPATIENT_CLINIC_OR_DEPARTMENT_OTHER): Payer: Self-pay

## 2022-03-09 LAB — CT, NG, MYCOPLASMAS NAA, URINE
Chlamydia trachomatis, NAA: NEGATIVE
Mycoplasma genitalium NAA: NEGATIVE
Mycoplasma hominis NAA: POSITIVE — AB
Neisseria gonorrhoeae, NAA: NEGATIVE
Ureaplasma spp NAA: POSITIVE — AB

## 2022-03-14 ENCOUNTER — Telehealth (HOSPITAL_BASED_OUTPATIENT_CLINIC_OR_DEPARTMENT_OTHER): Payer: Self-pay | Admitting: Nurse Practitioner

## 2022-03-14 NOTE — Telephone Encounter (Signed)
Received a fax on 4/23 from old fax machine for pts FMLA forms. Pt was seen as a NP with [facility] on 4/24. Paperwork is placed in [place] tray in the back. Not sure about payment due to possibly discussing in NP appt on 4/24. CMA will confirm when filling paperwork out. Please advise. ?

## 2022-03-15 ENCOUNTER — Other Ambulatory Visit (HOSPITAL_BASED_OUTPATIENT_CLINIC_OR_DEPARTMENT_OTHER): Payer: Self-pay | Admitting: Nurse Practitioner

## 2022-03-15 DIAGNOSIS — A493 Mycoplasma infection, unspecified site: Secondary | ICD-10-CM

## 2022-03-15 DIAGNOSIS — N341 Nonspecific urethritis: Secondary | ICD-10-CM

## 2022-03-15 MED ORDER — DOXYCYCLINE HYCLATE 100 MG PO TABS: 100.0000 mg | ORAL_TABLET | Freq: Two times a day (BID) | ORAL | 0 refills | Status: DC

## 2022-03-17 ENCOUNTER — Telehealth (HOSPITAL_BASED_OUTPATIENT_CLINIC_OR_DEPARTMENT_OTHER): Payer: Self-pay

## 2022-03-17 NOTE — Telephone Encounter (Signed)
FMLA paper work was completed and faxed to [employer] [phone]. Patient is aware.  ?

## 2022-03-20 NOTE — Telephone Encounter (Signed)
FMLA completed and faxed per CMA ?

## 2022-03-30 ENCOUNTER — Telehealth (HOSPITAL_BASED_OUTPATIENT_CLINIC_OR_DEPARTMENT_OTHER): Payer: 59 | Admitting: Nurse Practitioner

## 2022-03-30 ENCOUNTER — Telehealth (INDEPENDENT_AMBULATORY_CARE_PROVIDER_SITE_OTHER): Payer: 59 | Admitting: Nurse Practitioner

## 2022-04-05 NOTE — Progress Notes (Signed)
Unable to contact patient via telephone for visit.  No contact through [place].

## 2022-04-06 ENCOUNTER — Telehealth (HOSPITAL_BASED_OUTPATIENT_CLINIC_OR_DEPARTMENT_OTHER): Payer: 59 | Admitting: Nurse Practitioner

## 2022-04-13 ENCOUNTER — Encounter (HOSPITAL_BASED_OUTPATIENT_CLINIC_OR_DEPARTMENT_OTHER): Payer: Self-pay | Admitting: Nurse Practitioner

## 2022-04-13 ENCOUNTER — Telehealth (HOSPITAL_BASED_OUTPATIENT_CLINIC_OR_DEPARTMENT_OTHER): Payer: Self-pay

## 2022-04-13 NOTE — Telephone Encounter (Signed)
Patient came by the office stating the wrong paper work was filled out. He brought ADA forms they were completed and sent to [facility] [phone].   Neysa Bonito, CMA

## 2022-04-17 ENCOUNTER — Encounter (HOSPITAL_BASED_OUTPATIENT_CLINIC_OR_DEPARTMENT_OTHER): Payer: Self-pay | Admitting: Nurse Practitioner

## 2022-04-17 ENCOUNTER — Telehealth (INDEPENDENT_AMBULATORY_CARE_PROVIDER_SITE_OTHER): Payer: Managed Care, Other (non HMO) | Admitting: Nurse Practitioner

## 2022-04-17 DIAGNOSIS — R030 Elevated blood-pressure reading, without diagnosis of hypertension: Secondary | ICD-10-CM | POA: Diagnosis not present

## 2022-04-17 DIAGNOSIS — K6289 Other specified diseases of anus and rectum: Secondary | ICD-10-CM | POA: Diagnosis not present

## 2022-04-17 DIAGNOSIS — R3911 Hesitancy of micturition: Secondary | ICD-10-CM

## 2022-04-17 DIAGNOSIS — R350 Frequency of micturition: Secondary | ICD-10-CM

## 2022-04-17 NOTE — Assessment & Plan Note (Signed)
Chronic urinary hesitancy and incomplete emptying reported.  Etiology unknown at this time.  Recommend patient be evaluated by urology for complete exam and recommendations for next steps.  He is in agreement to this.  We will send referral today.

## 2022-04-17 NOTE — Progress Notes (Signed)
Virtual Visit Encounter mychart visit.   I connected with  Jon Rose on 04/17/22 at  8:50 AM EDT by secure video and audio telemedicine application. I verified that I am speaking with the correct person using two identifiers.   I introduced myself as a Designer, jewellery with the practice. The limitations of evaluation and management by telemedicine discussed with the patient and the availability of in person appointments. The patient expressed verbal understanding and consent to proceed.  Participating parties in this visit include: Myself and patient  The patient is: Patient Location: Other:  car I am: Provider Location: Office/Clinic Subjective:    CC and HPI: Jon Rose is a 26 y.o. year old male presenting for follow up of test results and d BP. Patient reports the following: Urinary Hesitancy and BM pain - urine stream will stop and go several times during voiding, consistent problem  - this has been going on for years- did not know this was not normal until a friend told him to have it checked - most nights he sleeps through the night with no need to urinate - urination through the day is frequent - no pain or burning with urination - he has had some pain sharp pain in the rectum with BM and expulsion of gas- lasts only a few seconds- resolves spontaneously - he does endorses that once he did notice a small amount of blood on his stool after a BM, several months ago, small amount, has not recurred - still having frequent BM's   BP -130/80 average at home - No severe HA, palpitations, ShOB, dizziness, no cough - He says that occasionally he will have a mild sharp pain on the right or left pectoral area that lasts only a few seconds then goes away- no other symptoms with this   Past medical history, Surgical history, Family history not pertinant except as noted below, Social history, Allergies, and medications have been entered into the medical record, reviewed, and  corrections made.   Review of Systems:  All review of systems negative except what is listed in the HPI  Objective:    Alert and oriented x 4 Speaking in clear sentences with no shortness of breath. No distress.  Impression and Recommendations:    Problem List Items Addressed This Visit     Frequent urination    Frequent urination with urinary hesitancy and start and stop of stream reported.  This has been ongoing and consistent.  Urination during the night is not typically excessive however it is during the day.  Labs have been evaluated with no clear etiology.  Given the patient's reported symptoms of hesitancy we will send referral for urology at this time.  We will plan to follow.       Elevated BP without diagnosis of hypertension - Primary    Patient reports blood pressure on average 130/80 at home. No alarm symptoms at this time.  Next line discussed with patient the recommendation to start low-dose blood pressure medicine given that his blood pressures are remaining above the threshold of 120/80. Patient does not wish to start medication at this time but tells me that he would be open to this if he is "for sure" his blood pressure is remaining elevated. Given his current urinary symptoms we will send referral to urology for further evaluation.  We may be able to start medication as necessary to help with urinary hypertension and blood pressure simultaneously. We will plan to follow-up with patient in approximately  3 months once he has a chance to see urology and check his blood pressure again.       Urinary hesitancy    Chronic urinary hesitancy and incomplete emptying reported.  Etiology unknown at this time.  Recommend patient be evaluated by urology for complete exam and recommendations for next steps.  He is in agreement to this.  We will send referral today.       Relevant Orders   Ambulatory referral to Urology   Rectal pain    Sharp intermittent rectal pain noted  with bowel movement and flatulence.  Not occurring with every bowel movement or passage of gas.  Consider possible IBS symptoms.  Unclear if this is related to urinary symptoms present.  PSA was measured on last visit and was found to be normal.  We will start with urology evaluation and consider GI referral if symptoms persist.       Relevant Orders   Ambulatory referral to Urology    orders and follow up as documented in EMR I discussed the assessment and treatment plan with the patient. The patient was provided an opportunity to ask questions and all were answered. The patient agreed with the plan and demonstrated an understanding of the instructions.   The patient was advised to call back or seek an in-person evaluation if the symptoms worsen or if the condition fails to improve as anticipated.  Follow-Up: in 3 months  I provided 32 minutes of non-face-to-face interaction with this non face-to-face encounter including intake, same-day documentation, and chart review.   Orma Render, NP , DNP, AGNP-c [facility] [phone] (fax)

## 2022-04-17 NOTE — Patient Instructions (Addendum)
I have sent the referral to urology for you.   I want to see you back in 3 months for your blood pressure.

## 2022-04-17 NOTE — Assessment & Plan Note (Signed)
Frequent urination with urinary hesitancy and start and stop of stream reported.  This has been ongoing and consistent.  Urination during the night is not typically excessive however it is during the day.  Labs have been evaluated with no clear etiology.  Given the patient's reported symptoms of hesitancy we will send referral for urology at this time.  We will plan to follow.

## 2022-04-17 NOTE — Assessment & Plan Note (Signed)
Sharp intermittent rectal pain noted with bowel movement and flatulence.  Not occurring with every bowel movement or passage of gas.  Consider possible IBS symptoms.  Unclear if this is related to urinary symptoms present.  PSA was measured on last visit and was found to be normal.  We will start with urology evaluation and consider GI referral if symptoms persist.

## 2022-04-17 NOTE — Assessment & Plan Note (Signed)
Patient reports blood pressure on average 130/80 at home. No alarm symptoms at this time.  Next line discussed with patient the recommendation to start low-dose blood pressure medicine given that his blood pressures are remaining above the threshold of 120/80. Patient does not wish to start medication at this time but tells me that he would be open to this if he is "for sure" his blood pressure is remaining elevated. Given his current urinary symptoms we will send referral to urology for further evaluation.  We may be able to start medication as necessary to help with urinary hypertension and blood pressure simultaneously. We will plan to follow-up with patient in approximately 3 months once he has a chance to see urology and check his blood pressure again.

## 2022-05-15 ENCOUNTER — Encounter (HOSPITAL_COMMUNITY): Payer: Self-pay | Admitting: Emergency Medicine

## 2022-05-15 ENCOUNTER — Ambulatory Visit (INDEPENDENT_AMBULATORY_CARE_PROVIDER_SITE_OTHER): Payer: Managed Care, Other (non HMO)

## 2022-05-15 ENCOUNTER — Telehealth (HOSPITAL_COMMUNITY): Payer: Self-pay | Admitting: Internal Medicine

## 2022-05-15 ENCOUNTER — Ambulatory Visit (HOSPITAL_COMMUNITY)
Admission: EM | Admit: 2022-05-15 | Discharge: 2022-05-15 | Disposition: A | Discharge status: Home / Self Care | Payer: Managed Care, Other (non HMO) | Attending: Internal Medicine | Admitting: Internal Medicine

## 2022-05-15 DIAGNOSIS — Z20822 Contact with and (suspected) exposure to covid-19: Secondary | ICD-10-CM | POA: Insufficient documentation

## 2022-05-15 DIAGNOSIS — R0981 Nasal congestion: Secondary | ICD-10-CM | POA: Diagnosis not present

## 2022-05-15 DIAGNOSIS — R059 Cough, unspecified: Secondary | ICD-10-CM | POA: Diagnosis not present

## 2022-05-15 DIAGNOSIS — J209 Acute bronchitis, unspecified: Secondary | ICD-10-CM | POA: Diagnosis not present

## 2022-05-15 DIAGNOSIS — J029 Acute pharyngitis, unspecified: Secondary | ICD-10-CM | POA: Insufficient documentation

## 2022-05-15 DIAGNOSIS — Z87891 Personal history of nicotine dependence: Secondary | ICD-10-CM | POA: Diagnosis not present

## 2022-05-15 DIAGNOSIS — R519 Headache, unspecified: Secondary | ICD-10-CM | POA: Insufficient documentation

## 2022-05-15 LAB — SARS CORONAVIRUS 2 (TAT 6-24 HRS): SARS Coronavirus 2: NEGATIVE

## 2022-05-15 LAB — POCT RAPID STREP A, ED / UC: Streptococcus, Group A Screen (Direct): NEGATIVE

## 2022-05-15 MED ORDER — PROMETHAZINE-DM 6.25-15 MG/5ML PO SYRP: 5.0000 mL | ORAL_SOLUTION | Freq: Four times a day (QID) | ORAL | 0 refills | Status: AC | PRN

## 2022-05-15 MED ORDER — ACETAMINOPHEN 500 MG PO TABS: 1000.0000 mg | ORAL_TABLET | Freq: Four times a day (QID) | ORAL | 0 refills | Status: AC | PRN

## 2022-05-15 MED ORDER — ALBUTEROL SULFATE HFA 108 (90 BASE) MCG/ACT IN AERS
INHALATION_SPRAY | RESPIRATORY_TRACT | Status: AC
  Filled 2022-05-15: qty 6.7

## 2022-05-15 MED ORDER — ALBUTEROL SULFATE HFA 108 (90 BASE) MCG/ACT IN AERS
2.0000 | INHALATION_SPRAY | Freq: Once | RESPIRATORY_TRACT | Status: AC
  Administered 2022-05-15: 2 via RESPIRATORY_TRACT

## 2022-05-15 MED ORDER — IBUPROFEN 800 MG PO TABS
800.0000 mg | ORAL_TABLET | Freq: Once | ORAL | Status: AC
Start: 2022-05-15 — End: 2022-05-15
  Administered 2022-05-15: 800 mg via ORAL

## 2022-05-15 MED ORDER — ONDANSETRON 4 MG PO TBDP: 4.0000 mg | ORAL_TABLET | Freq: Three times a day (TID) | ORAL | 0 refills | Status: AC | PRN

## 2022-05-15 MED ORDER — PREDNISONE 20 MG PO TABS: 40.0000 mg | ORAL_TABLET | Freq: Every day | ORAL | 0 refills | Status: AC

## 2022-05-15 MED ORDER — IBUPROFEN 800 MG PO TABS
ORAL_TABLET | ORAL | Status: AC
  Filled 2022-05-15: qty 1

## 2022-05-15 MED ORDER — ALBUTEROL SULFATE HFA 108 (90 BASE) MCG/ACT IN AERS: 1.0000 | INHALATION_SPRAY | Freq: Four times a day (QID) | RESPIRATORY_TRACT | 0 refills | Status: AC | PRN

## 2022-05-15 MED ORDER — IBUPROFEN 600 MG PO TABS: 600.0000 mg | ORAL_TABLET | Freq: Four times a day (QID) | ORAL | 0 refills | Status: DC | PRN

## 2022-05-15 MED ORDER — GUAIFENESIN ER 1200 MG PO TB12: 1200.0000 mg | ORAL_TABLET | Freq: Two times a day (BID) | ORAL | 0 refills | Status: AC

## 2022-05-15 NOTE — ED Triage Notes (Signed)
Patient c/o sore throat, loss of voice, headache, vomiting since last night.  Patient has taken Tylenol.

## 2022-05-15 NOTE — Discharge Instructions (Addendum)
You have bronchitis.  Your x-ray is negative.  Take prednisone 40 mg once daily with breakfast for the next 5 days.  Do not take any NSAID containing medications while taking prednisone because it can increase the risk of GI bleeding.  Take this medication with food to avoid GI upset.  Take 4 mg of Zofran every 8 hours as needed for nausea and vomiting.  Take guaifenesin every 12 hours to thin your mucus so that you are able to cough it up and blow it out of your nose easier.  Drink plenty of water while taking this medicine so that it works best in your body.  You may take Tylenol 1000 mg every 6 hours as needed for pain and fever.  You may use albuterol inhaler 1 to 2 puffs every 6 hours as needed for shortness of breath and wheezing.  Use Promethazine DM cough syrup at nighttime to help with your cough.  Do not drink alcohol or drive while taking this medicine as it can make you very drowsy.  If you develop any new or worsening symptoms or do not improve in the next 2 to 3 days, please return.  If your symptoms are severe, please go to the emergency room.  Follow-up with your primary care provider for further evaluation and management of your symptoms as well as ongoing wellness visits.  I hope you feel better!

## 2022-05-15 NOTE — ED Provider Notes (Signed)
[facility]    CSN: [id number] Arrival date & time: 05/15/22  0850      History   Chief Complaint Chief Complaint  Patient presents with   Sore Throat    HPI Jon Rose is a 26 y.o. male.   Patient presents to urgent care for evaluation of sore throat, headache, nasal congestion, and cough that started last night.  Also reports 1 episode of vomiting last night that he states appeared to be liquid in consistency and without blood.  Cough is dry and nagging.  Denies abdominal pain, fever, chills, urinary symptoms, neck pain, chest pain, dizziness, and body aches.  He is reporting a headache that is currently a 4 on a scale of 0-10 and mostly localized to the frontal area of his head.  Reports throat pain at a 5 on a scale of 0-10 that is worse with swallowing.  He took ibuprofen last night with minimal relief of his symptoms.  He also attempted salt water gargles and drinking warm tea to help with his throat pain with mild relief.  He denies use of any other over-the-counter medications to treat his symptoms.  Denies recent sick contacts.  He is a former cigarette smoker and quit last year.  He works at an office job and denies history of respiratory problems including asthma in the past.     Sore Throat    Past Medical History:  Diagnosis Date   Acute nonintractable headache 03/06/2022   Deviated septum     Patient Active Problem List   Diagnosis Date Noted   Urinary hesitancy 04/17/2022   Rectal pain 04/17/2022   Frequent urination 03/06/2022   Frequent diarrhea 03/06/2022   Bilious vomiting with nausea 03/06/2022   Elevated BP without diagnosis of hypertension 03/06/2022   Substernal chest pain 03/06/2022   Neck and shoulder pain 03/06/2022   Cervicogenic headache 03/06/2022   Chronic left shoulder pain 07/18/2017    Past Surgical History:  Procedure Laterality Date   left shoulder surgery         Home Medications    Prior to Admission medications    Medication Sig Start Date End Date Taking? Authorizing Provider  acetaminophen (TYLENOL) 500 MG tablet Take 2 tablets (1,000 mg total) by mouth every 6 (six) hours as needed. 05/15/22  Yes Carlisle Beers, FNP  albuterol (VENTOLIN HFA) 108 (90 Base) MCG/ACT inhaler Inhale 1-2 puffs into the lungs every 6 (six) hours as needed for wheezing or shortness of breath. 05/15/22  Yes Carlisle Beers, FNP  Guaifenesin 1200 MG TB12 Take 1 tablet (1,200 mg total) by mouth in the morning and at bedtime. 05/15/22  Yes Carlisle Beers, FNP  ondansetron (ZOFRAN-ODT) 4 MG disintegrating tablet Take 1 tablet (4 mg total) by mouth every 8 (eight) hours as needed for nausea or vomiting. 05/15/22  Yes Carlisle Beers, FNP  predniSONE (DELTASONE) 20 MG tablet Take 2 tablets (40 mg total) by mouth daily for 5 days. 05/15/22 05/20/22 Yes StanhopeDonavan Burnet, FNP  promethazine-dextromethorphan (PROMETHAZINE-DM) 6.25-15 MG/5ML syrup Take 5 mLs by mouth 4 (four) times daily as needed for cough. 05/15/22  Yes Daltyn Degroat, Donavan Burnet, FNP    Family History Family History  Family history unknown: Yes    Social History Social History   Tobacco Use   Smoking status: Former    Packs/day: 0.10    Types: Cigarettes, Cigars   Smokeless tobacco: Never  Vaping Use   Vaping Use: Never used  Substance  Use Topics   Alcohol use: No   Drug use: Yes    Types: Marijuana     Allergies   Patient has no known allergies.   Review of Systems Review of Systems Per HPI  Physical Exam Triage Vital Signs ED Triage Vitals  Enc Vitals Group     BP 05/15/22 0929 127/88     Pulse Rate 05/15/22 0929 78     Resp 05/15/22 0929 18     Temp 05/15/22 0929 98.1 F (36.7 C)     Temp Source 05/15/22 0929 Oral     SpO2 05/15/22 0929 95 %     Weight 05/15/22 0931 240 lb (108.9 kg)     Height 05/15/22 0931 5\' 8"  (1.727 m)     Head Circumference --      Peak Flow --      Pain Score 05/15/22 0931 6     Pain Loc --       Pain Edu? --      Excl. in GC? --    No data found.  Updated Vital Signs BP 127/88 (BP Location: Right Arm)   Pulse 78   Temp 98.1 F (36.7 C) (Oral)   Resp 18   Ht 5\' 8"  (1.727 m)   Wt 240 lb (108.9 kg)   SpO2 95%   BMI 36.49 kg/m   Visual Acuity Right Eye Distance:   Left Eye Distance:   Bilateral Distance:    Right Eye Near:   Left Eye Near:    Bilateral Near:     Physical Exam Vitals and nursing note reviewed.  Constitutional:      Appearance: Normal appearance. He is not ill-appearing or toxic-appearing.     Comments: Very pleasant patient sitting on exam in position of comfort table in no acute distress.   HENT:     Head: Normocephalic and atraumatic.     Right Ear: Hearing, tympanic membrane, ear canal and external ear normal.     Left Ear: Hearing, tympanic membrane, ear canal and external ear normal.     Nose: Nose normal.     Mouth/Throat:     Lips: Pink.     Mouth: Mucous membranes are moist.     Pharynx: Posterior oropharyngeal erythema present.     Comments: Mild erythema to posterior oropharynx with small amount of clear postnasal drainage visualized. Airway intact and patent. Eyes:     General: Lids are normal. Vision grossly intact. Gaze aligned appropriately.     Extraocular Movements: Extraocular movements intact.     Conjunctiva/sclera: Conjunctivae normal.  Cardiovascular:     Rate and Rhythm: Normal rate and regular rhythm.     Heart sounds: Normal heart sounds, S1 normal and S2 normal.  Pulmonary:     Effort: Pulmonary effort is normal. No accessory muscle usage or respiratory distress.     Breath sounds: Normal air entry. Examination of the right-upper field reveals wheezing. Examination of the left-upper field reveals wheezing. Examination of the right-middle field reveals wheezing. Examination of the left-middle field reveals wheezing. Examination of the right-lower field reveals wheezing. Examination of the left-lower field reveals wheezing.  Wheezing present. No rhonchi or rales.  Abdominal:     General: Bowel sounds are normal.     Palpations: Abdomen is soft.     Tenderness: There is no abdominal tenderness. There is no right CVA tenderness, left CVA tenderness or guarding.  Musculoskeletal:     Cervical back: Normal range of motion and neck  supple.  Lymphadenopathy:     Cervical: Cervical adenopathy present.  Skin:    General: Skin is warm and dry.     Capillary Refill: Capillary refill takes less than 2 seconds.     Findings: No rash.  Neurological:     General: No focal deficit present.     Mental Status: He is alert and oriented to person, place, and time. Mental status is at baseline.     Cranial Nerves: No dysarthria or facial asymmetry.     Motor: No weakness.     Gait: Gait is intact. Gait normal.  Psychiatric:        Mood and Affect: Mood normal.        Speech: Speech normal.        Behavior: Behavior normal.        Thought Content: Thought content normal.        Judgment: Judgment normal.      UC Treatments / Results  Labs (all labs ordered are listed, but only abnormal results are displayed) Labs Reviewed  SARS CORONAVIRUS 2 (TAT 6-24 HRS)  CULTURE, GROUP A STREP [facility])  POCT RAPID STREP A, ED / UC    EKG   Radiology DG Chest 2 View  Result Date: 05/15/2022 CLINICAL DATA:  Cough EXAM: CHEST - 2 VIEW COMPARISON:  Chest x-ray dated October 07, 2021 FINDINGS: The heart size and mediastinal contours are within normal limits. Both lungs are clear. The visualized skeletal structures are unremarkable. IMPRESSION: No active cardiopulmonary disease. Electronically Signed   By: Allegra Lai M.D.   On: 05/15/2022 10:41    Procedures Procedures (including critical care time)  Medications Ordered in UC Medications  ibuprofen (ADVIL) tablet 800 mg (800 mg Oral Given 05/15/22 1020)  albuterol (VENTOLIN HFA) 108 (90 Base) MCG/ACT inhaler 2 puff (2 puffs Inhalation Given 05/15/22 1027)    Initial  Impression / Assessment and Plan / UC Course  I have reviewed the triage vital signs and the nursing notes.  Pertinent labs & imaging results that were available during my care of the patient were reviewed by me and considered in my medical decision making (see chart for details).  1.  Acute bronchitis Chest x-ray negative for acute cardiopulmonary abnormality.  Wheezes heard to auscultation are likely related to acute bronchitis.  Albuterol inhaler administered in clinic with improvement of cardiopulmonary exam post inhaler administration.  Wheezing to auscultation resolved completely after albuterol inhaler.  Plan to treat with prednisone 40 mg once daily in the morning with breakfast for the next 5 days.  Patient may use Promethazine DM at nighttime for cough related to acute bronchitis.  Instructed patient not to use his medicine with alcohol or while driving as it can cause him to become sleepy.  He may use Zofran 4 mg every 8 hours as needed for nausea and vomiting.  Guaifenesin every 12 hours as needed for nasal congestion and thick mucus.  Encourage patient to increase fluid intake to prevent dehydration and help guaifenesin work in his body better.  Patient to avoid NSAID medications while taking prednisone to decrease risk of GI bleeding.  Given 800 mg of ibuprofen in the clinic today for sore throat and head pain with mild relief of symptoms.  COVID-19 testing is pending.  Discussed physical exam and available lab work findings in clinic with patient.  Counseled patient regarding appropriate use of medications and potential side effects for all medications recommended or prescribed today. Discussed red flag signs and  symptoms of worsening condition,when to call the PCP office, return to urgent care, and when to seek higher level of care in the emergency department. Patient verbalizes understanding and agreement with plan. All questions answered. Patient discharged in stable condition.  Final  Clinical Impressions(s) / UC Diagnoses   Final diagnoses:  Acute bronchitis, unspecified organism  Sore throat  Nasal congestion  Acute nonintractable headache, unspecified headache type     Discharge Instructions      You have bronchitis.  Your x-ray is negative.  Take prednisone 40 mg once daily with breakfast for the next 5 days.  Do not take any NSAID containing medications while taking prednisone because it can increase the risk of GI bleeding.  Take this medication with food to avoid GI upset.  Take 4 mg of Zofran every 8 hours as needed for nausea and vomiting.  Take guaifenesin every 12 hours to thin your mucus so that you are able to cough it up and blow it out of your nose easier.  Drink plenty of water while taking this medicine so that it works best in your body.  You may take Tylenol 1000 mg every 6 hours as needed for pain and fever.  You may use albuterol inhaler 1 to 2 puffs every 6 hours as needed for shortness of breath and wheezing.  Use Promethazine DM cough syrup at nighttime to help with your cough.  Do not drink alcohol or drive while taking this medicine as it can make you very drowsy.  If you develop any new or worsening symptoms or do not improve in the next 2 to 3 days, please return.  If your symptoms are severe, please go to the emergency room.  Follow-up with your primary care provider for further evaluation and management of your symptoms as well as ongoing wellness visits.  I hope you feel better!     ED Prescriptions     Medication Sig Dispense Auth. Provider   ondansetron (ZOFRAN-ODT) 4 MG disintegrating tablet Take 1 tablet (4 mg total) by mouth every 8 (eight) hours as needed for nausea or vomiting. 20 tablet Reita May M, FNP   predniSONE (DELTASONE) 20 MG tablet Take 2 tablets (40 mg total) by mouth daily for 5 days. 10 tablet Carlisle Beers, FNP   albuterol (VENTOLIN HFA) 108 (90 Base) MCG/ACT inhaler Inhale 1-2 puffs into  the lungs every 6 (six) hours as needed for wheezing or shortness of breath. 18 g Reita May M, FNP   Guaifenesin 1200 MG TB12 Take 1 tablet (1,200 mg total) by mouth in the morning and at bedtime. 14 tablet Reita May M, FNP   ibuprofen (ADVIL) 600 MG tablet  (Status: Discontinued) Take 1 tablet (600 mg total) by mouth every 6 (six) hours as needed. 30 tablet Reita May M, FNP   acetaminophen (TYLENOL) 500 MG tablet Take 2 tablets (1,000 mg total) by mouth every 6 (six) hours as needed. 30 tablet Carlisle Beers, FNP   promethazine-dextromethorphan (PROMETHAZINE-DM) 6.25-15 MG/5ML syrup Take 5 mLs by mouth 4 (four) times daily as needed for cough. 118 mL Carlisle Beers, FNP      PDMP not reviewed this encounter.   Carlisle Beers, [place] 05/15/22 1108

## 2022-05-18 LAB — CULTURE, GROUP A STREP (THRC)

## 2022-05-28 NOTE — Telephone Encounter (Signed)
Created encounter in error

## 2022-06-08 ENCOUNTER — Encounter (HOSPITAL_BASED_OUTPATIENT_CLINIC_OR_DEPARTMENT_OTHER): Payer: Self-pay | Admitting: Nurse Practitioner

## 2022-06-27 IMAGING — CR DG CHEST 2V
2 series · 2 of 2 positions shown · non-contrast
Comparison: 08/23/2018

CLINICAL DATA: Shortness of breath, cough

EXAM:
CHEST - 2 VIEW

[chest pa]
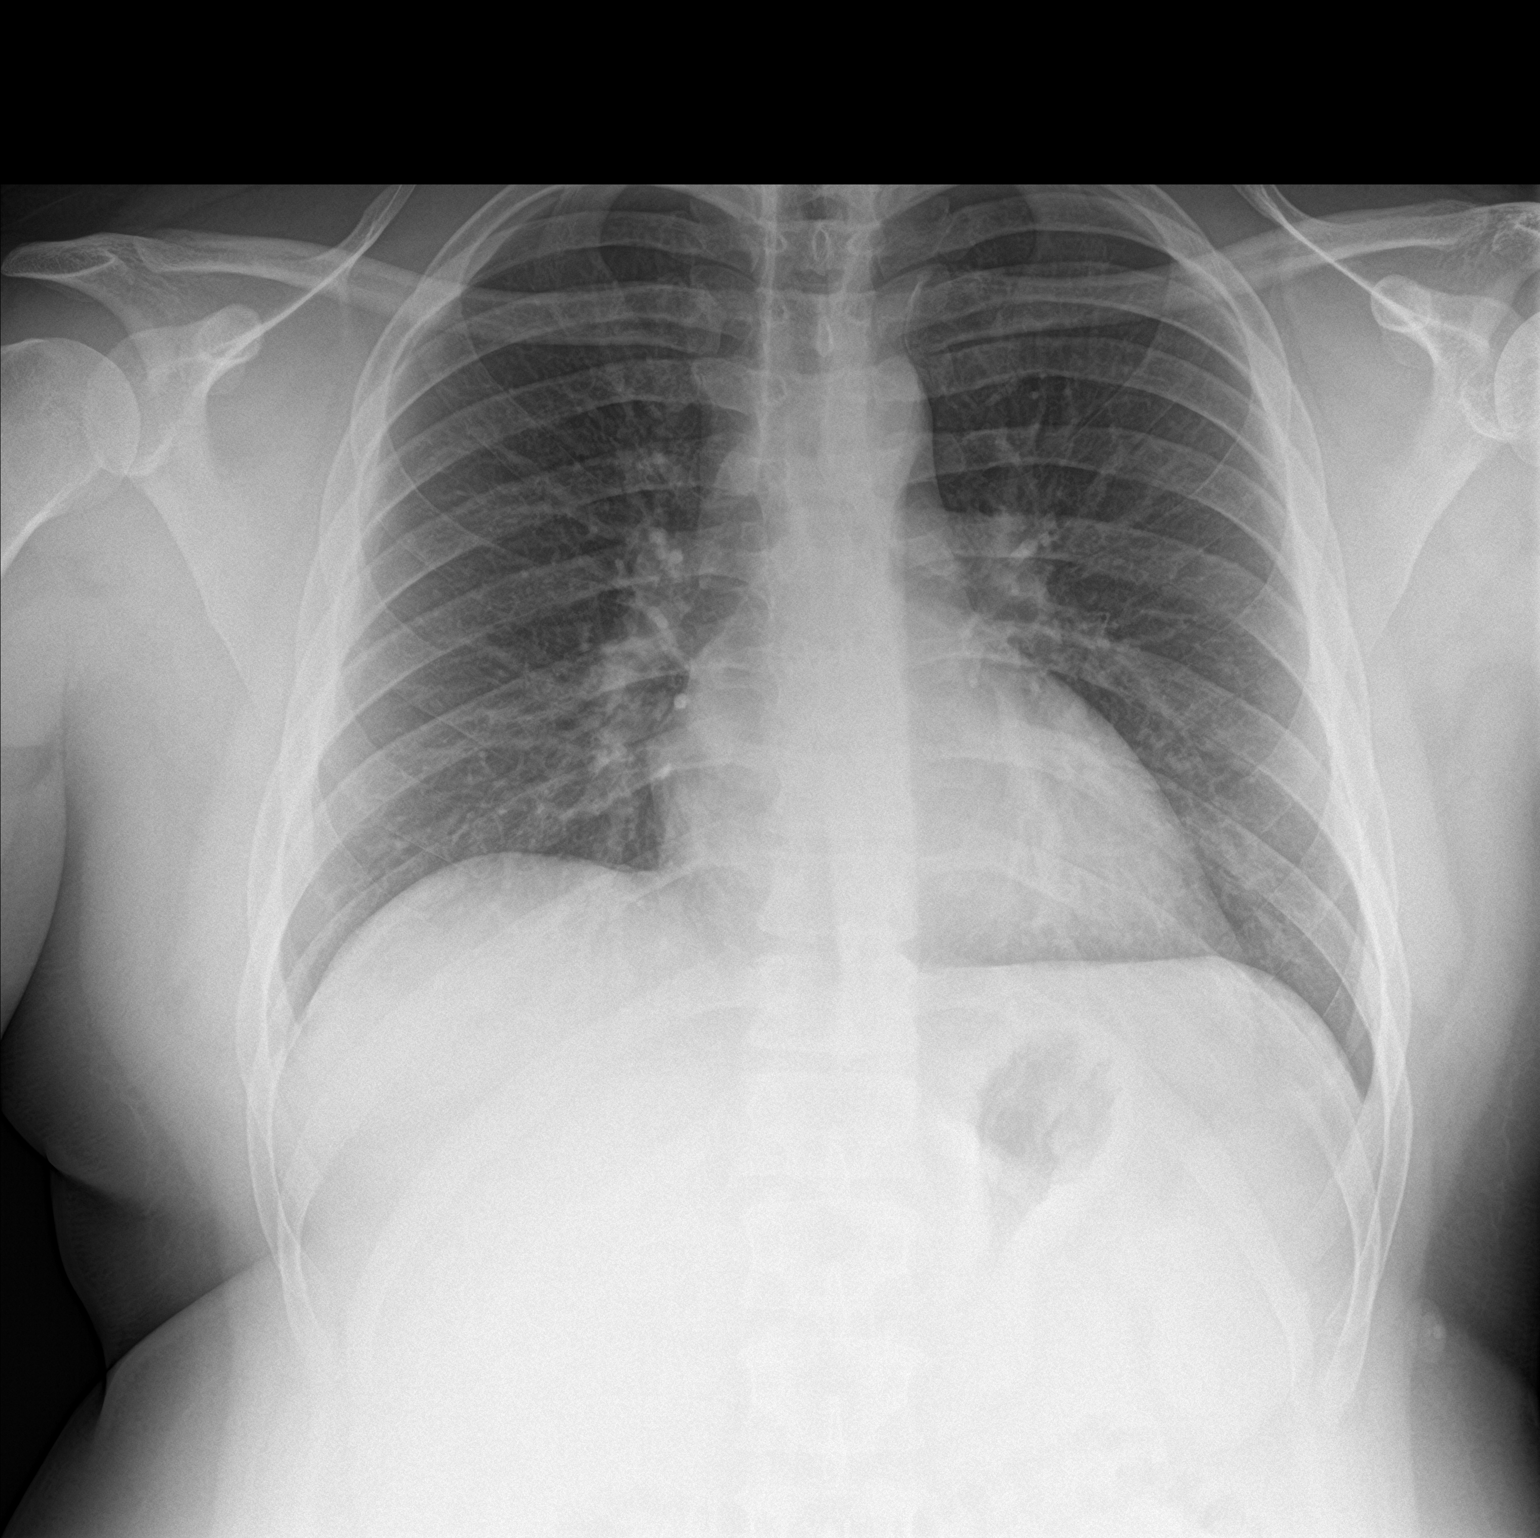

[chest lat]
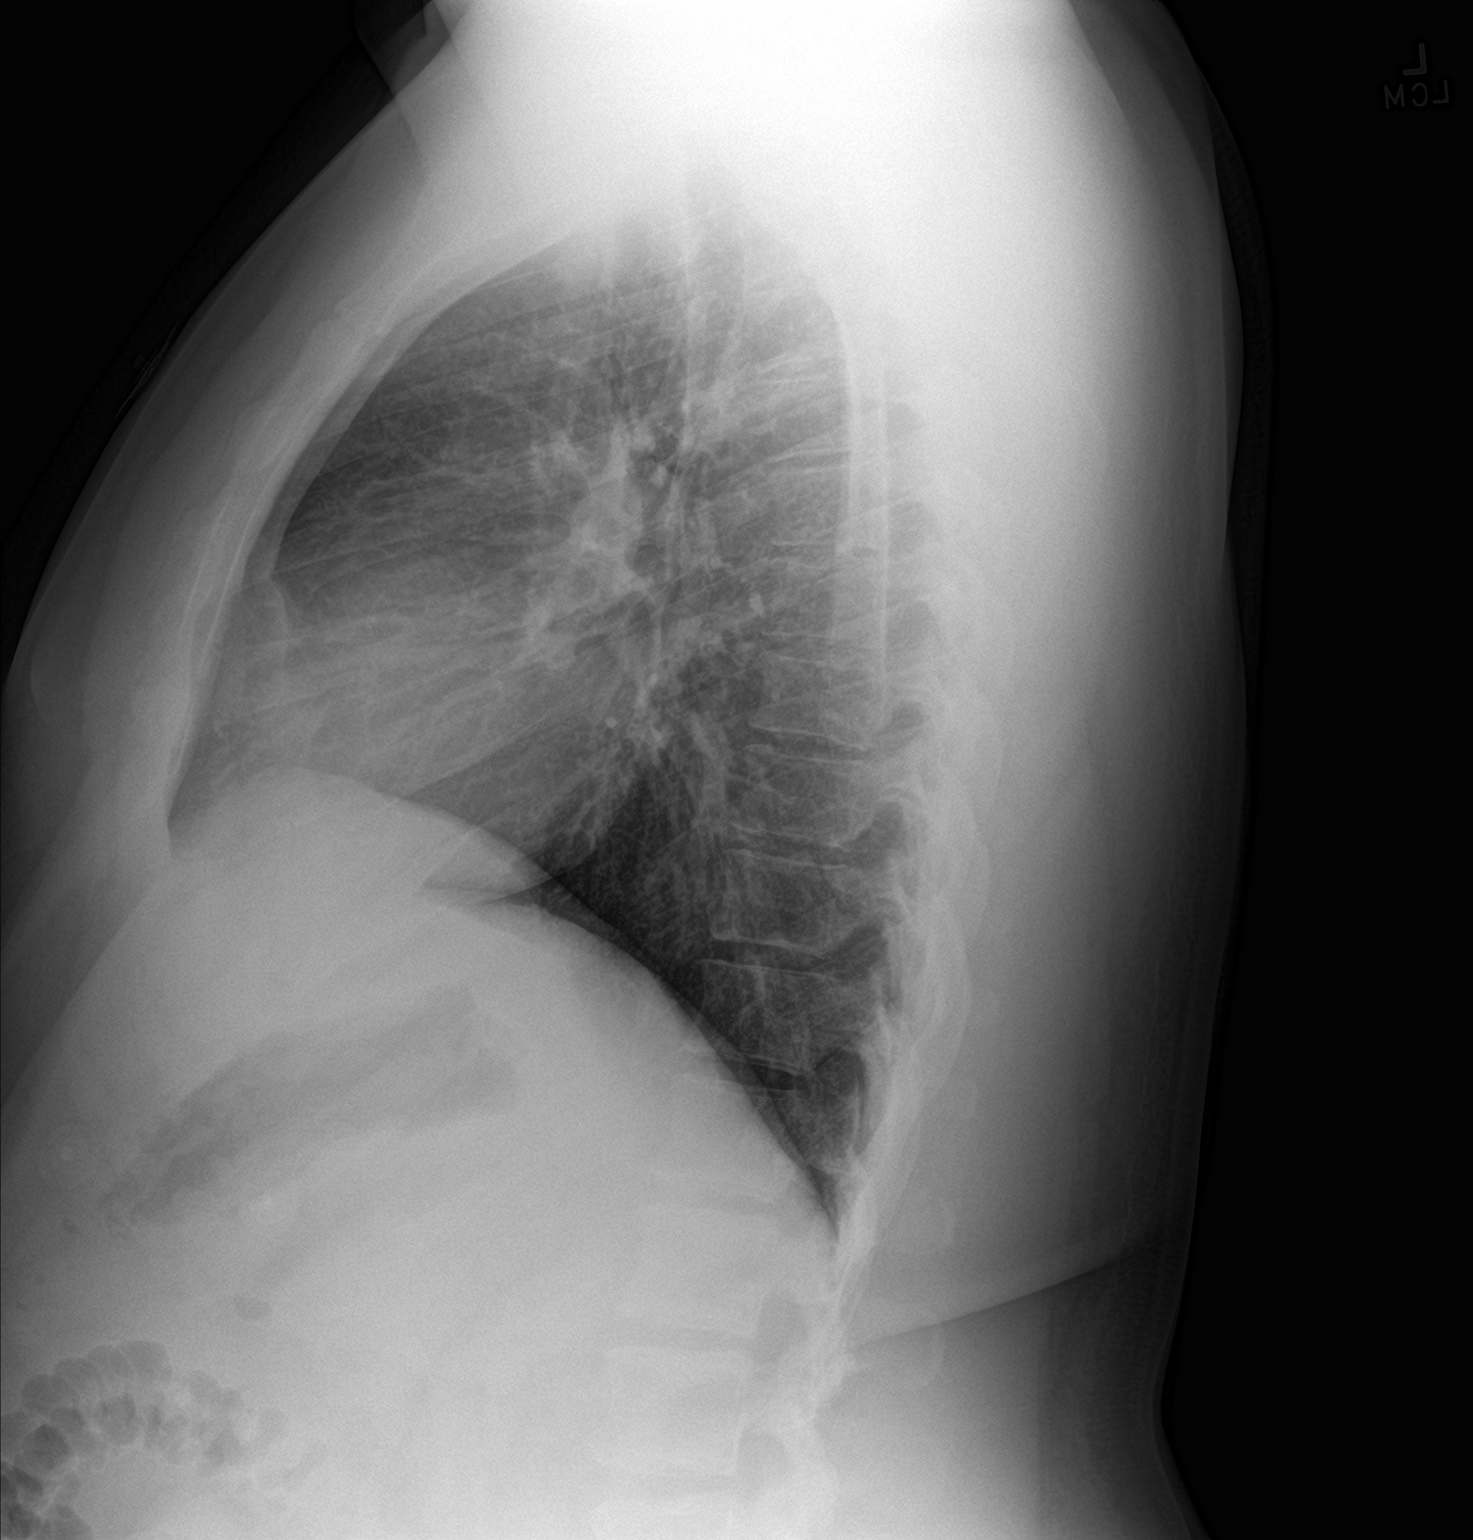

[2 of 2 positions shown; findings below may reference images not displayed]

FINDINGS: The heart size and mediastinal contours are within normal limits.
Both lungs are clear. The visualized skeletal structures are
unremarkable.
IMPRESSION: No acute abnormality of the lungs.

## 2022-12-06 ENCOUNTER — Emergency Department (HOSPITAL_COMMUNITY)
Admission: EM | Admit: 2022-12-06 | Discharge: 2022-12-06 | Disposition: A | Discharge status: Home / Self Care | Payer: 59 | Attending: Emergency Medicine | Admitting: Emergency Medicine

## 2022-12-06 DIAGNOSIS — U071 COVID-19: Secondary | ICD-10-CM | POA: Insufficient documentation

## 2022-12-06 LAB — RESP PANEL BY RT-PCR (RSV, FLU A&B, COVID)  RVPGX2
Influenza A by PCR: NEGATIVE
Influenza B by PCR: NEGATIVE
Resp Syncytial Virus by PCR: NEGATIVE
SARS Coronavirus 2 by RT PCR: POSITIVE — AB

## 2022-12-06 NOTE — ED Triage Notes (Signed)
Pt c/o URI symptoms after pos exposure to COVID person earlier in the week. Pt endorses congestion, cough, malaise, sore throat.

## 2022-12-06 NOTE — ED Provider Notes (Signed)
[place] EMERGENCY DEPARTMENT AT [facility] Provider Note   CSN: [id number] Arrival date & time: 12/06/22  1019     History  Chief Complaint  Patient presents with   URI    Martinique Sondgeroth is a 27 y.o. male.  Patient presents the emergency department complaining of cough, chills, body aches, sore throat which began yesterday.  Patient states that he was exposed to multiple people at work with [employer] earlier this week.  Patient denies fevers, shortness of breath, chest pain, abdominal pain, nausea, vomiting.  Past medical history significant for deviated septum  HPI     Home Medications Prior to Admission medications   Medication Sig Start Date End Date Taking? Authorizing Provider  acetaminophen (TYLENOL) 500 MG tablet Take 2 tablets (1,000 mg total) by mouth every 6 (six) hours as needed. 05/15/22   Talbot Grumbling, FNP  albuterol (VENTOLIN HFA) 108 (90 Base) MCG/ACT inhaler Inhale 1-2 puffs into the lungs every 6 (six) hours as needed for wheezing or shortness of breath. 05/15/22   Talbot Grumbling, FNP  Guaifenesin 1200 MG TB12 Take 1 tablet (1,200 mg total) by mouth in the morning and at bedtime. 05/15/22   Talbot Grumbling, FNP  ondansetron (ZOFRAN-ODT) 4 MG disintegrating tablet Take 1 tablet (4 mg total) by mouth every 8 (eight) hours as needed for nausea or vomiting. 05/15/22   Talbot Grumbling, FNP  promethazine-dextromethorphan (PROMETHAZINE-DM) 6.25-15 MG/5ML syrup Take 5 mLs by mouth 4 (four) times daily as needed for cough. 05/15/22   Talbot Grumbling, FNP      Allergies    Patient has no known allergies.    Review of Systems   Review of Systems  Constitutional:  Positive for chills. Negative for fever.  HENT:  Positive for sore throat.   Respiratory:  Positive for cough.   Musculoskeletal:  Positive for myalgias.    Physical Exam Updated Vital Signs BP 136/89 (BP Location: Left Arm)   Pulse (!) 101   Temp 98.4 F (36.9 C) (Oral)    Resp 17   SpO2 94%  Physical Exam Vitals and nursing note reviewed.  Constitutional:      General: He is not in acute distress.    Appearance: He is well-developed.  HENT:     Head: Normocephalic and atraumatic.  Eyes:     Conjunctiva/sclera: Conjunctivae normal.  Cardiovascular:     Rate and Rhythm: Normal rate and regular rhythm.     Heart sounds: No murmur heard. Pulmonary:     Effort: Pulmonary effort is normal. No respiratory distress.     Breath sounds: Normal breath sounds.  Abdominal:     Palpations: Abdomen is soft.     Tenderness: There is no abdominal tenderness.  Musculoskeletal:        General: No swelling.     Cervical back: Neck supple.  Skin:    General: Skin is warm and dry.     Capillary Refill: Capillary refill takes less than 2 seconds.  Neurological:     Mental Status: He is alert.  Psychiatric:        Mood and Affect: Mood normal.     ED Results / Procedures / Treatments   Labs (all labs ordered are listed, but only abnormal results are displayed) Labs Reviewed  RESP PANEL BY RT-PCR (RSV, FLU A&B, COVID)  RVPGX2 - Abnormal; Notable for the following components:      Result Value   SARS Coronavirus 2 by RT  PCR POSITIVE (*)    All other components within normal limits    EKG None  Radiology No results found.  Procedures Procedures    Medications Ordered in ED Medications - No data to display  ED Course/ Medical Decision Making/ A&P                             Medical Decision Making  This patient presents to the ED for concern of upper respiratory symptoms, this involves an extensive number of treatment options, and is a complaint that carries with it a high risk of complications and morbidity.  The differential diagnosis includes COVID-19, influenza, RSV, other viral illnesses, pneumonia, others    Additional history obtained:  No relevant recent labs available for review   Lab Tests:  I Ordered, and personally  interpreted labs.  The pertinent results include: COVID-positive   Imaging Studies ordered:  Lungs are clear to auscultation bilaterally.  No indication for chest x-ray at this time   Test / Admission - Considered:  Patient tested positive for COVID-19.  Patient has no comorbidities that would require antiviral therapy at this time.  Plan to discharge patient home with instructions for supportive care.  Patient voices understanding with instructions.  Return precautions provided        Final Clinical Impression(s) / ED Diagnoses Final diagnoses:  COVID-19    Rx / DC Orders ED Discharge Orders     None         Ronny Bacon 12/06/22 1314    Rancour, Annie Main, MD 12/06/22 1442

## 2022-12-06 NOTE — Discharge Instructions (Addendum)
You were diagnosed today with COVID-19.  Please take over-the-counter medications such as ibuprofen and acetaminophen as needed for fever and pain control.  Please be sure to get plenty of rest.  Hydrate as you are able.  If you develop any life-threatening symptoms please return to the emergency department.

## 2022-12-11 ENCOUNTER — Ambulatory Visit
Admission: RE | Admit: 2022-12-11 | Discharge: 2022-12-11 | Disposition: A | Discharge status: Home / Self Care | Payer: 59 | Source: Ambulatory Visit | Attending: Urgent Care | Admitting: Urgent Care

## 2022-12-11 VITALS — BP 135/89 | HR 89 | Temp 98.4°F | Resp 20

## 2022-12-11 DIAGNOSIS — U071 COVID-19: Secondary | ICD-10-CM

## 2022-12-11 MED ORDER — PSEUDOEPHEDRINE HCL 60 MG PO TABS: 60.0000 mg | ORAL_TABLET | Freq: Three times a day (TID) | ORAL | 0 refills | Status: AC | PRN

## 2022-12-11 MED ORDER — CETIRIZINE HCL 10 MG PO TABS: 10.0000 mg | ORAL_TABLET | Freq: Every day | ORAL | 0 refills | Status: AC

## 2022-12-11 MED ORDER — IBUPROFEN 600 MG PO TABS: 600.0000 mg | ORAL_TABLET | Freq: Four times a day (QID) | ORAL | 0 refills | Status: AC | PRN

## 2022-12-11 NOTE — ED Provider Notes (Signed)
[facility]  Note:  This document was prepared using Systems analyst and may include unintentional dictation errors.  MRN: [id number] DOB: 1996-07-09  Subjective:   Jon Rose is a 27 y.o. male presenting for recheck at the request of his employer. Tested positive for COVID 19 12/06/2022, symptoms started 12/05/2022.  Reports that he had a fever yesterday, continues to have sinus congestion, sinus drainage and a cough that occasionally elicits mild chest pain.  No respiratory disorders.  No active sinus pain, facial pain, oral pain, ear pain, shortness of breath or wheezing.  No current facility-administered medications for this encounter.  Current Outpatient Medications:    acetaminophen (TYLENOL) 500 MG tablet, Take 2 tablets (1,000 mg total) by mouth every 6 (six) hours as needed., Disp: 30 tablet, Rfl: 0   albuterol (VENTOLIN HFA) 108 (90 Base) MCG/ACT inhaler, Inhale 1-2 puffs into the lungs every 6 (six) hours as needed for wheezing or shortness of breath., Disp: 18 g, Rfl: 0   Guaifenesin 1200 MG TB12, Take 1 tablet (1,200 mg total) by mouth in the morning and at bedtime., Disp: 14 tablet, Rfl: 0   ondansetron (ZOFRAN-ODT) 4 MG disintegrating tablet, Take 1 tablet (4 mg total) by mouth every 8 (eight) hours as needed for nausea or vomiting., Disp: 20 tablet, Rfl: 0   promethazine-dextromethorphan (PROMETHAZINE-DM) 6.25-15 MG/5ML syrup, Take 5 mLs by mouth 4 (four) times daily as needed for cough., Disp: 118 mL, Rfl: 0   No Known Allergies  Past Medical History:  Diagnosis Date   Acute nonintractable headache 03/06/2022   Deviated septum      Past Surgical History:  Procedure Laterality Date   left shoulder surgery      Family History  Family history unknown: Yes    Social History   Tobacco Use   Smoking status: Former    Packs/day: 0.10    Types: Cigarettes, Cigars   Smokeless tobacco: Never  Vaping Use   Vaping Use:  Former  Substance Use Topics   Alcohol use: No   Drug use: Not Currently    ROS   Objective:   Vitals: BP 135/89 (BP Location: Right Arm)   Pulse 89   Temp 98.4 F (36.9 C) (Oral)   Resp 20   SpO2 95%   Physical Exam Constitutional:      General: He is not in acute distress.    Appearance: Normal appearance. He is well-developed and normal weight. He is not ill-appearing, toxic-appearing or diaphoretic.  HENT:     Head: Normocephalic and atraumatic.     Right Ear: Tympanic membrane, ear canal and external ear normal. No drainage, swelling or tenderness. No middle ear effusion. There is no impacted cerumen. Tympanic membrane is not erythematous or bulging.     Left Ear: Tympanic membrane, ear canal and external ear normal. No drainage, swelling or tenderness.  No middle ear effusion. There is no impacted cerumen. Tympanic membrane is not erythematous or bulging.     Nose: Congestion and rhinorrhea present.     Mouth/Throat:     Mouth: Mucous membranes are moist.     Pharynx: No pharyngeal swelling, oropharyngeal exudate, posterior oropharyngeal erythema or uvula swelling.     Tonsils: No tonsillar exudate or tonsillar abscesses. 0 on the right. 0 on the left.  Eyes:     General: No scleral icterus.       Right eye: No discharge.        Left eye:  No discharge.     Extraocular Movements: Extraocular movements intact.     Conjunctiva/sclera: Conjunctivae normal.  Cardiovascular:     Rate and Rhythm: Normal rate and regular rhythm.     Heart sounds: Normal heart sounds. No murmur heard.    No friction rub. No gallop.  Pulmonary:     Effort: Pulmonary effort is normal. No respiratory distress.     Breath sounds: Normal breath sounds. No stridor. No wheezing, rhonchi or rales.  Musculoskeletal:     Cervical back: Normal range of motion and neck supple. No rigidity. No muscular tenderness.  Neurological:     General: No focal deficit present.     Mental Status: He is alert  and oriented to person, place, and time.  Psychiatric:        Mood and Affect: Mood normal.        Behavior: Behavior normal.        Thought Content: Thought content normal.        Judgment: Judgment normal.     Assessment and Plan :   PDMP not reviewed this encounter.  1. COVID-19     Will defer antibiotic use for now.  Recommended patient's start using supportive care medications.  Deferred imaging given clear cardiopulmonary exam, hemodynamically stable vital signs.  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  Counseled patient on potential for adverse effects with medications prescribed/recommended today, ER and return-to-clinic precautions discussed, patient verbalized understanding.    Jaynee Eagles, PA-C 12/11/22 1428

## 2022-12-11 NOTE — ED Triage Notes (Signed)
Pt +covid positive 1/24- states he was to return to work today-was advised by work he needs to be seen again due to fever until yesterday am-NAD-steady gait

## 2023-01-24 ENCOUNTER — Emergency Department (HOSPITAL_BASED_OUTPATIENT_CLINIC_OR_DEPARTMENT_OTHER)
Admission: EM | Admit: 2023-01-24 | Discharge: 2023-01-24 | Disposition: A | Discharge status: Home / Self Care | Payer: Self-pay | Attending: Emergency Medicine | Admitting: Emergency Medicine

## 2023-01-24 ENCOUNTER — Encounter (HOSPITAL_BASED_OUTPATIENT_CLINIC_OR_DEPARTMENT_OTHER): Payer: Self-pay | Admitting: Emergency Medicine

## 2023-01-24 ENCOUNTER — Other Ambulatory Visit: Payer: Self-pay

## 2023-01-24 DIAGNOSIS — K529 Noninfective gastroenteritis and colitis, unspecified: Secondary | ICD-10-CM | POA: Insufficient documentation

## 2023-01-24 DIAGNOSIS — D72829 Elevated white blood cell count, unspecified: Secondary | ICD-10-CM | POA: Insufficient documentation

## 2023-01-24 DIAGNOSIS — Z1152 Encounter for screening for COVID-19: Secondary | ICD-10-CM | POA: Insufficient documentation

## 2023-01-24 DIAGNOSIS — R000 Tachycardia, unspecified: Secondary | ICD-10-CM | POA: Insufficient documentation

## 2023-01-24 LAB — COMPREHENSIVE METABOLIC PANEL
ALT: 43 U/L (ref 0–44)
AST: 29 U/L (ref 15–41)
Albumin: 4 g/dL (ref 3.5–5.0)
Alkaline Phosphatase: 90 U/L (ref 38–126)
Anion gap: 5 (ref 5–15)
BUN: 7 mg/dL (ref 6–20)
CO2: 26 mmol/L (ref 22–32)
Calcium: 8.3 mg/dL — ABNORMAL LOW (ref 8.9–10.3)
Chloride: 103 mmol/L (ref 98–111)
Creatinine, Ser: 0.88 mg/dL (ref 0.61–1.24)
GFR, Estimated: >60 mL/min (ref >60)
Glucose, Bld: 100 mg/dL — ABNORMAL HIGH (ref 70–99)
Potassium: 3.7 mmol/L (ref 3.5–5.1)
Sodium: 134 mmol/L — ABNORMAL LOW (ref 135–145)
Total Bilirubin: 1 mg/dL (ref 0.3–1.2)
Total Protein: 7 g/dL (ref 6.5–8.1)

## 2023-01-24 LAB — CBC WITH DIFFERENTIAL/PLATELET
Abs Immature Granulocytes: 0.05 10*3/uL (ref 0.00–0.07)
Basophils Absolute: 0 10*3/uL (ref 0.0–0.1)
Basophils Relative: 0 %
Eosinophils Absolute: 0.1 10*3/uL (ref 0.0–0.5)
Eosinophils Relative: 1 %
HCT: 46.9 % (ref 39.0–52.0)
Hemoglobin: 16.1 g/dL (ref 13.0–17.0)
Immature Granulocytes: 0 %
Lymphocytes Relative: 6 %
Lymphs Abs: 0.8 10*3/uL (ref 0.7–4.0)
MCH: 29.7 pg (ref 26.0–34.0)
MCHC: 34.3 g/dL (ref 30.0–36.0)
MCV: 86.4 fL (ref 80.0–100.0)
Monocytes Absolute: 0.6 10*3/uL (ref 0.1–1.0)
Monocytes Relative: 4 %
Neutro Abs: 12 10*3/uL — ABNORMAL HIGH (ref 1.7–7.7)
Neutrophils Relative %: 89 %
Platelets: 312 10*3/uL (ref 150–400)
RBC: 5.43 MIL/uL (ref 4.22–5.81)
RDW: 11.9 % (ref 11.5–15.5)
WBC: 13.5 10*3/uL — ABNORMAL HIGH (ref 4.0–10.5)
nRBC: 0 % (ref 0.0–0.2)

## 2023-01-24 LAB — LIPASE, BLOOD: Lipase: 21 U/L (ref 11–51)

## 2023-01-24 LAB — RESP PANEL BY RT-PCR (RSV, FLU A&B, COVID)  RVPGX2
Influenza A by PCR: NEGATIVE
Influenza B by PCR: NEGATIVE
Resp Syncytial Virus by PCR: NEGATIVE
SARS Coronavirus 2 by RT PCR: NEGATIVE

## 2023-01-24 MED ORDER — ONDANSETRON HCL 4 MG/2ML IJ SOLN
4.0000 mg | Freq: Once | INTRAMUSCULAR | Status: AC
  Administered 2023-01-24: 4 mg via INTRAVENOUS
  Filled 2023-01-24: qty 2

## 2023-01-24 MED ORDER — SODIUM CHLORIDE 0.9 % IV BOLUS
1000.0000 mL | Freq: Once | INTRAVENOUS | Status: AC
  Administered 2023-01-24: 1000 mL via INTRAVENOUS

## 2023-01-24 MED ORDER — ONDANSETRON 4 MG PO TBDP: ORAL_TABLET | ORAL | 0 refills | Status: AC

## 2023-01-24 MED ORDER — PANTOPRAZOLE SODIUM 40 MG IV SOLR
40.0000 mg | Freq: Once | INTRAVENOUS | Status: AC
  Administered 2023-01-24: 40 mg via INTRAVENOUS
  Filled 2023-01-24: qty 10

## 2023-01-24 NOTE — ED Triage Notes (Addendum)
Pt reports NVD, chills, body aches since 0430

## 2023-01-24 NOTE — ED Notes (Signed)
Discharge paperwork reviewed entirely with patient, including Rx's and follow up care. Pain was under control. Pt verbalized understanding as well as all parties involved. No questions or concerns voiced at the time of discharge. No acute distress noted.   Pt ambulated out to PVA without incident or assistance.  

## 2023-01-24 NOTE — ED Provider Notes (Signed)
[place] [facility] Provider Note   CSN: [id number] Arrival date & time: 01/24/23  1309     History  Chief Complaint  Patient presents with   Diarrhea   Emesis    Jon Rose is a 27 y.o. male.  Patient is a 27 year old male who presents with nausea vomiting and diarrhea.  He woke up about 430 this morning with the symptoms.  He also has some myalgias and chills.  No known fevers.  He is having some intermittent crampy abdominal pain.  No known sick contacts.  No recent travel.  He says he did not eat anything unusual.  No cough or cold symptoms.       Home Medications Prior to Admission medications   Medication Sig Start Date End Date Taking? Authorizing Provider  ondansetron (ZOFRAN-ODT) 4 MG disintegrating tablet '4mg'$  ODT q4 hours prn nausea/vomit 01/24/23  Yes Malvin Johns, MD  acetaminophen (TYLENOL) 500 MG tablet Take 2 tablets (1,000 mg total) by mouth every 6 (six) hours as needed. Patient not taking: Reported on 01/24/2023 05/15/22   Talbot Grumbling, FNP  albuterol (VENTOLIN HFA) 108 (90 Base) MCG/ACT inhaler Inhale 1-2 puffs into the lungs every 6 (six) hours as needed for wheezing or shortness of breath. Patient not taking: Reported on 01/24/2023 05/15/22   Talbot Grumbling, FNP  cetirizine (ZYRTEC ALLERGY) 10 MG tablet Take 1 tablet (10 mg total) by mouth daily. Patient not taking: Reported on 01/24/2023 12/11/22   Jaynee Eagles, PA-C  Guaifenesin 1200 MG TB12 Take 1 tablet (1,200 mg total) by mouth in the morning and at bedtime. Patient not taking: Reported on 01/24/2023 05/15/22   Talbot Grumbling, FNP  ibuprofen (ADVIL) 600 MG tablet Take 1 tablet (600 mg total) by mouth every 6 (six) hours as needed. Patient not taking: Reported on 01/24/2023 12/11/22   Jaynee Eagles, PA-C  ondansetron (ZOFRAN-ODT) 4 MG disintegrating tablet Take 1 tablet (4 mg total) by mouth every 8 (eight) hours as needed for nausea or vomiting. Patient  not taking: Reported on 01/24/2023 05/15/22   Talbot Grumbling, FNP  promethazine-dextromethorphan (PROMETHAZINE-DM) 6.25-15 MG/5ML syrup Take 5 mLs by mouth 4 (four) times daily as needed for cough. Patient not taking: Reported on 01/24/2023 05/15/22   Talbot Grumbling, FNP  pseudoephedrine (SUDAFED) 60 MG tablet Take 1 tablet (60 mg total) by mouth every 8 (eight) hours as needed for congestion. Patient not taking: Reported on 01/24/2023 12/11/22   Jaynee Eagles, PA-C      Allergies    Patient has no known allergies.    Review of Systems   Review of Systems  Constitutional:  Positive for chills. Negative for diaphoresis, fatigue and fever.  HENT:  Negative for congestion, rhinorrhea and sneezing.   Eyes: Negative.   Respiratory:  Negative for cough, chest tightness and shortness of breath.   Cardiovascular:  Negative for chest pain and leg swelling.  Gastrointestinal:  Positive for abdominal pain, diarrhea, nausea and vomiting. Negative for blood in stool.  Genitourinary:  Negative for difficulty urinating, flank pain, frequency and hematuria.  Musculoskeletal:  Negative for arthralgias and back pain.  Skin:  Negative for rash.  Neurological:  Negative for dizziness, speech difficulty, weakness, numbness and headaches.    Physical Exam Updated Vital Signs BP (!) 114/55   Pulse (!) 103   Temp 98.9 F (37.2 C) (Oral)   Resp 17   Ht '5\' 8"'$  (1.727 m)   Wt 117.9 kg  SpO2 98%   BMI 39.53 kg/m  Physical Exam Constitutional:      Appearance: He is well-developed.  HENT:     Head: Normocephalic and atraumatic.  Eyes:     Pupils: Pupils are equal, round, and reactive to light.  Cardiovascular:     Rate and Rhythm: Normal rate and regular rhythm.     Heart sounds: Normal heart sounds.  Pulmonary:     Effort: Pulmonary effort is normal. No respiratory distress.     Breath sounds: Normal breath sounds. No wheezing or rales.  Chest:     Chest wall: No tenderness.  Abdominal:      General: Bowel sounds are normal.     Palpations: Abdomen is soft.     Tenderness: There is abdominal tenderness (Mild diffuse tenderness). There is no guarding or rebound.  Musculoskeletal:        General: Normal range of motion.     Cervical back: Normal range of motion and neck supple.  Lymphadenopathy:     Cervical: No cervical adenopathy.  Skin:    General: Skin is warm and dry.     Findings: No rash.  Neurological:     Mental Status: He is alert and oriented to person, place, and time.     ED Results / Procedures / Treatments   Labs (all labs ordered are listed, but only abnormal results are displayed) Labs Reviewed  COMPREHENSIVE METABOLIC PANEL - Abnormal; Notable for the following components:      Result Value   Sodium 134 (*)    Glucose, Bld 100 (*)    Calcium 8.3 (*)    All other components within normal limits  CBC WITH DIFFERENTIAL/PLATELET - Abnormal; Notable for the following components:   WBC 13.5 (*)    Neutro Abs 12.0 (*)    All other components within normal limits  RESP PANEL BY RT-PCR (RSV, FLU A&B, COVID)  RVPGX2  LIPASE, BLOOD    EKG None  Radiology No results found.  Procedures Procedures    Medications Ordered in ED Medications  sodium chloride 0.9 % bolus 1,000 mL ( Intravenous Stopped 01/24/23 1652)  ondansetron (ZOFRAN) injection 4 mg (4 mg Intravenous Given 01/24/23 1550)  pantoprazole (PROTONIX) injection 40 mg (40 mg Intravenous Given 01/24/23 1551)    ED Course/ Medical Decision Making/ A&P                             Medical Decision Making Amount and/or Complexity of Data Reviewed Labs: ordered.  Risk Prescription drug management.   Patient is a 27 year old male who presents with nausea vomiting diarrhea.  He is afebrile.  He initially had some mild crampy abdominal pain.  He was given IV fluids and antiemetics.  He is feeling much better after this.  His labs are nonconcerning.  His abdominal exam is benign.  At this  point I do not feel that he needs imaging.  He is very anxious to leave.  He still has some mild tachycardia although it has improved after IV fluids.  However he is not willing to stay for any further treatment.  I suspect he likely has a viral gastroenteritis.  He was discharged home in good condition.  Return precautions were given.  He was given a prescription for Zofran.  Final Clinical Impression(s) / ED Diagnoses Final diagnoses:  Gastroenteritis    Rx / DC Orders ED Discharge Orders  Ordered    ondansetron (ZOFRAN-ODT) 4 MG disintegrating tablet        01/24/23 1726              Malvin Johns, MD 01/24/23 1735

## 2023-01-24 NOTE — ED Notes (Signed)
Patient stated he wanted to leave as he has been here for over 2 hours. Spoke with EDP in which advised he is still tachycardic and labs have not come back quite yet. Patient was told he can leave AMA but agreed to stay until his labs come back.

## 2023-05-01 ENCOUNTER — Other Ambulatory Visit: Payer: Self-pay

## 2023-05-01 ENCOUNTER — Emergency Department (HOSPITAL_BASED_OUTPATIENT_CLINIC_OR_DEPARTMENT_OTHER)
Admission: EM | Admit: 2023-05-01 | Discharge: 2023-05-01 | Disposition: A | Discharge status: Home / Self Care | Payer: Self-pay | Attending: Emergency Medicine | Admitting: Emergency Medicine

## 2023-05-01 ENCOUNTER — Encounter (HOSPITAL_BASED_OUTPATIENT_CLINIC_OR_DEPARTMENT_OTHER): Payer: Self-pay

## 2023-05-01 ENCOUNTER — Encounter (HOSPITAL_BASED_OUTPATIENT_CLINIC_OR_DEPARTMENT_OTHER): Payer: Self-pay | Admitting: *Deleted

## 2023-05-01 DIAGNOSIS — W57XXXA Bitten or stung by nonvenomous insect and other nonvenomous arthropods, initial encounter: Secondary | ICD-10-CM | POA: Insufficient documentation

## 2023-05-01 DIAGNOSIS — S20361A Insect bite (nonvenomous) of right front wall of thorax, initial encounter: Secondary | ICD-10-CM | POA: Insufficient documentation

## 2023-05-01 DIAGNOSIS — S30860A Insect bite (nonvenomous) of lower back and pelvis, initial encounter: Secondary | ICD-10-CM | POA: Insufficient documentation

## 2023-05-01 NOTE — Discharge Instructions (Addendum)
You have been seen today for your complaint of tick bite. Follow up with: Your primary care provider as needed.  Specifically follow-up if you develop any type of rash to the area of the bite Please seek immediate medical care if you develop any of the following symptoms: You cannot remove a tick. You cannot move (have paralysis) or feel weak. You are feeling worse or have new symptoms. You find a tick that is biting you and filled with blood, especially if you are in an area where diseases from ticks are common. At this time there does not appear to be the presence of an emergent medical condition, however there is always the potential for conditions to change. Please read and follow the below instructions.  Do not take your medicine if  develop an itchy rash, swelling in your mouth or lips, or difficulty breathing; call 911 and seek immediate emergency medical attention if this occurs.  You may review your lab tests and imaging results in their entirety on your MyChart account.  Please discuss all results of fully with your primary care provider and other specialist at your follow-up visit.  Note: Portions of this text may have been transcribed using voice recognition software. Every effort was made to ensure accuracy; however, inadvertent computerized transcription errors may still be present.

## 2023-05-01 NOTE — ED Triage Notes (Signed)
POV from home, A&O x 4, GCS 15, amb to triage  Sts that wife pulled tick off of right axilla/upper rib area approx 15 mins ago, lightheaded since this happened. No redness or mark from removal.

## 2023-05-01 NOTE — Discharge Instructions (Signed)
Take was removed.  I showed you how to remove ticks at home.  I also gave you tweezers.

## 2023-05-01 NOTE — ED Provider Notes (Signed)
[place] EMERGENCY DEPARTMENT AT [facility] Provider Note   CSN: [id number] Arrival date & time: 05/01/23  2317     History  Chief Complaint  Patient presents with   Tick Removal    Jon Rose is a 27 y.o. male.  Who presents to the ED for evaluation of a tick removal.  He was seen in the emergency department approximately 2 hours prior to this because he was feeling lightheaded after his wife removed a tick just prior to that encounter.  Returns because he noticed an additional tick to the left lower back.  Did not attempt removal.  Denies any symptoms.  HPI     Home Medications Prior to Admission medications   Medication Sig Start Date End Date Taking? Authorizing Provider  acetaminophen (TYLENOL) 500 MG tablet Take 2 tablets (1,000 mg total) by mouth every 6 (six) hours as needed. Patient not taking: Reported on 01/24/2023 05/15/22   Carlisle Beers, FNP  albuterol (VENTOLIN HFA) 108 (90 Base) MCG/ACT inhaler Inhale 1-2 puffs into the lungs every 6 (six) hours as needed for wheezing or shortness of breath. Patient not taking: Reported on 01/24/2023 05/15/22   Carlisle Beers, FNP  cetirizine (ZYRTEC ALLERGY) 10 MG tablet Take 1 tablet (10 mg total) by mouth daily. Patient not taking: Reported on 01/24/2023 12/11/22   Wallis Bamberg, PA-C  Guaifenesin 1200 MG TB12 Take 1 tablet (1,200 mg total) by mouth in the morning and at bedtime. Patient not taking: Reported on 01/24/2023 05/15/22   Carlisle Beers, FNP  ibuprofen (ADVIL) 600 MG tablet Take 1 tablet (600 mg total) by mouth every 6 (six) hours as needed. Patient not taking: Reported on 01/24/2023 12/11/22   Wallis Bamberg, PA-C  ondansetron (ZOFRAN-ODT) 4 MG disintegrating tablet Take 1 tablet (4 mg total) by mouth every 8 (eight) hours as needed for nausea or vomiting. Patient not taking: Reported on 01/24/2023 05/15/22   Carlisle Beers, FNP  ondansetron (ZOFRAN-ODT) 4 MG disintegrating tablet 4mg  ODT q4  hours prn nausea/vomit 01/24/23   Rolan Bucco, MD  promethazine-dextromethorphan (PROMETHAZINE-DM) 6.25-15 MG/5ML syrup Take 5 mLs by mouth 4 (four) times daily as needed for cough. Patient not taking: Reported on 01/24/2023 05/15/22   Carlisle Beers, FNP  pseudoephedrine (SUDAFED) 60 MG tablet Take 1 tablet (60 mg total) by mouth every 8 (eight) hours as needed for congestion. Patient not taking: Reported on 01/24/2023 12/11/22   Wallis Bamberg, PA-C      Allergies    Patient has no known allergies.    Review of Systems   Review of Systems  All other systems reviewed and are negative.   Physical Exam Updated Vital Signs Temp 98.2 F (36.8 C) (Oral)  Physical Exam Vitals and nursing note reviewed.  Constitutional:      General: He is not in acute distress.    Appearance: Normal appearance. He is normal weight. He is not ill-appearing.  HENT:     Head: Normocephalic and atraumatic.  Pulmonary:     Effort: Pulmonary effort is normal. No respiratory distress.  Abdominal:     General: Abdomen is flat.  Musculoskeletal:        General: Normal range of motion.     Cervical back: Neck supple.  Skin:    General: Skin is warm and dry.     Comments: Small tick superficially embedded to the skin of the left lower back  Neurological:     Mental Status: He is alert and  oriented to person, place, and time.  Psychiatric:        Mood and Affect: Mood normal.        Behavior: Behavior normal.     ED Results / Procedures / Treatments   Labs (all labs ordered are listed, but only abnormal results are displayed) Labs Reviewed - No data to display  EKG None  Radiology No results found.  Procedures Procedures    Medications Ordered in ED Medications - No data to display  ED Course/ Medical Decision Making/ A&P                             Medical Decision Making This patient presents to the ED for concern of tick removal  My initial workup includes tick  removal  Additional history obtained from: Nursing notes from this visit.  28 year old male presenting to the ED for evaluation of a tick removal.  Presented an hour earlier because he was feeling lightheaded after his wife removed to take.  He has a fear of insects.  He noticed an additional tick to the left lower back when he returned home.  Removal was not attempted prior to arrival.  Tick was very superficially embedded.  I removed without difficulty.  Stable at discharge.  At this time there does not appear to be any evidence of an acute emergency medical condition and the patient appears stable for discharge with appropriate outpatient follow up. Diagnosis was discussed with patient who verbalizes understanding of care plan and is agreeable to discharge. I have discussed return precautions with patient and wife who verbalizes understanding. Patient encouraged to follow-up with their PCP as needed. All questions answered.  Note: Portions of this report may have been transcribed using voice recognition software. Every effort was made to ensure accuracy; however, inadvertent computerized transcription errors may still be present.        Final Clinical Impression(s) / ED Diagnoses Final diagnoses:  Tick bite with subsequent removal of tick    Rx / DC Orders ED Discharge Orders     None         Michelle Piper, Cordelia Poche 05/01/23 2341    Shon Baton, MD 05/03/23 [phone]

## 2023-05-01 NOTE — ED Triage Notes (Signed)
Pt has additional tick to lower L side of his back

## 2023-05-01 NOTE — ED Provider Notes (Signed)
[place] EMERGENCY DEPARTMENT AT [facility] Provider Note   CSN: [id number] Arrival date & time: 05/01/23  2159     History  Chief Complaint  Patient presents with   Insect Bite    Jon Rose is a 27 y.o. male.  With no significant past medical history who presents to the ED for evaluation of a tick bite.  He states his wife pulled a tick off of his right axilla approximately 15 minutes prior to arrival.  States he has felt lightheaded since that time.  He has a fairly serious fear of insects.  HPI     Home Medications Prior to Admission medications   Medication Sig Start Date End Date Taking? Authorizing Provider  acetaminophen (TYLENOL) 500 MG tablet Take 2 tablets (1,000 mg total) by mouth every 6 (six) hours as needed. Patient not taking: Reported on 01/24/2023 05/15/22   Carlisle Beers, FNP  albuterol (VENTOLIN HFA) 108 (90 Base) MCG/ACT inhaler Inhale 1-2 puffs into the lungs every 6 (six) hours as needed for wheezing or shortness of breath. Patient not taking: Reported on 01/24/2023 05/15/22   Carlisle Beers, FNP  cetirizine (ZYRTEC ALLERGY) 10 MG tablet Take 1 tablet (10 mg total) by mouth daily. Patient not taking: Reported on 01/24/2023 12/11/22   Wallis Bamberg, PA-C  Guaifenesin 1200 MG TB12 Take 1 tablet (1,200 mg total) by mouth in the morning and at bedtime. Patient not taking: Reported on 01/24/2023 05/15/22   Carlisle Beers, FNP  ibuprofen (ADVIL) 600 MG tablet Take 1 tablet (600 mg total) by mouth every 6 (six) hours as needed. Patient not taking: Reported on 01/24/2023 12/11/22   Wallis Bamberg, PA-C  ondansetron (ZOFRAN-ODT) 4 MG disintegrating tablet Take 1 tablet (4 mg total) by mouth every 8 (eight) hours as needed for nausea or vomiting. Patient not taking: Reported on 01/24/2023 05/15/22   Carlisle Beers, FNP  ondansetron (ZOFRAN-ODT) 4 MG disintegrating tablet 4mg  ODT q4 hours prn nausea/vomit 01/24/23   Rolan Bucco, MD   promethazine-dextromethorphan (PROMETHAZINE-DM) 6.25-15 MG/5ML syrup Take 5 mLs by mouth 4 (four) times daily as needed for cough. Patient not taking: Reported on 01/24/2023 05/15/22   Carlisle Beers, FNP  pseudoephedrine (SUDAFED) 60 MG tablet Take 1 tablet (60 mg total) by mouth every 8 (eight) hours as needed for congestion. Patient not taking: Reported on 01/24/2023 12/11/22   Wallis Bamberg, PA-C      Allergies    Patient has no known allergies.    Review of Systems   Review of Systems  All other systems reviewed and are negative.   Physical Exam Updated Vital Signs BP 132/86   Pulse 92   Temp 97.7 F (36.5 C)   Resp 18   Ht 5\' 8"  (1.727 m)   Wt 113.4 kg   SpO2 96%   BMI 38.01 kg/m  Physical Exam Vitals and nursing note reviewed.  Constitutional:      General: He is not in acute distress.    Appearance: Normal appearance. He is normal weight. He is not ill-appearing.     Comments: Resting comfortably in chair  HENT:     Head: Normocephalic and atraumatic.  Eyes:     Extraocular Movements: Extraocular movements intact.     Pupils: Pupils are equal, round, and reactive to light.  Pulmonary:     Effort: Pulmonary effort is normal. No respiratory distress.     Breath sounds: No wheezing, rhonchi or rales.  Abdominal:  General: Abdomen is flat.     Tenderness: There is no abdominal tenderness. There is no guarding.  Musculoskeletal:        General: Normal range of motion.     Cervical back: Neck supple.  Skin:    General: Skin is warm and dry.     Coloration: Skin is not pale.  Neurological:     General: No focal deficit present.     Mental Status: He is alert and oriented to person, place, and time.  Psychiatric:        Mood and Affect: Mood normal.        Behavior: Behavior normal.     ED Results / Procedures / Treatments   Labs (all labs ordered are listed, but only abnormal results are displayed) Labs Reviewed - No data to  display  EKG None  Radiology No results found.  Procedures Procedures    Medications Ordered in ED Medications - No data to display  ED Course/ Medical Decision Making/ A&P                             Medical Decision Making This patient presents to the ED for concern of tick bite, this involves an extensive number of treatment options. The differential diagnosis includes tick bite, other insect bite  Additional history obtained from: Nursing notes from this visit.  Afebrile, hemodynamically stable.  27 year old male presenting to the ED for evaluation of an insect bite.  States his wife noticed a tick to his right axilla this evening.  She pulled the stick off just prior to arrival.  They did bring the tick in with them.  I observed a tick with the head still intact.  I did not see any obvious puncture wounds or insect bites to the mentioned area.  Presents today because he felt lightheaded and slightly nauseous.  States he has a fear of insects.  Suspect this is a vagal response.  No indication for empiric treatment for Lyme disease or [place] spotted fever at this time.  Patient was given orange juice in the ED and reported improvement in his symptoms.  He was given return precautions.  Stable at discharge.  At this time there does not appear to be any evidence of an acute emergency medical condition and the patient appears stable for discharge with appropriate outpatient follow up. Diagnosis was discussed with patient who verbalizes understanding of care plan and is agreeable to discharge. I have discussed return precautions with patient and wife who verbalizes understanding. Patient encouraged to follow-up with their PCP as needed. All questions answered.  Note: Portions of this report may have been transcribed using voice recognition software. Every effort was made to ensure accuracy; however, inadvertent computerized transcription errors may still be  present.        Final Clinical Impression(s) / ED Diagnoses Final diagnoses:  Tick bite of right front wall of thorax, initial encounter    Rx / DC Orders ED Discharge Orders     None         Mora Bellman 05/01/23 2227    Terrilee Files, MD 05/02/23 [phone]

## 2024-09-26 ENCOUNTER — Ambulatory Visit
Admission: EM | Admit: 2024-09-26 | Discharge: 2024-09-26 | Disposition: A | Discharge status: Home / Self Care | Attending: Internal Medicine | Admitting: Internal Medicine

## 2024-09-26 DIAGNOSIS — H1031 Unspecified acute conjunctivitis, right eye: Secondary | ICD-10-CM

## 2024-09-26 MED ORDER — ERYTHROMYCIN 5 MG/GM OP OINT
TOPICAL_OINTMENT | OPHTHALMIC | 0 refills | Status: AC
Start: 2024-09-26 — End: ?

## 2024-09-26 NOTE — Discharge Instructions (Addendum)
 Symptoms and physical exam findings are consistent with acute bacterial conjunctivitis of the right eye.  We will treat this with a topical antibiotic.  Will treat with the following: Erythromycin ointment apply a small amount into the eye directly twice daily for 7 days Make sure to use good hand hygiene especially before and after touching your eyes. Would avoid excessive light exposure by wearing sunglasses as conjunctivitis can make your eyes more sensitive to the sun Return to urgent care or PCP if symptoms worsen or fail to resolve.

## 2024-09-26 NOTE — ED Triage Notes (Signed)
 Pt present with c/o rt eye redness, itching and swelling x this morning. Pt states he woke up this morning with eye closed shut and with crust around it. Pt states he would like a note for work and antibiotics

## 2024-09-26 NOTE — ED Provider Notes (Signed)
 [facility]    CSN: [id number] Arrival date & time: 09/26/24  1431      History   Chief Complaint Chief Complaint  Patient presents with   Eye Problem    HPI Jon Rose is a 28 y.o. male.   28 year old male presents urgent care with complaints of right eye pain, drainage, redness.  He reports that this started when he woke up this morning.  He he woke up this morning with purulent drainage as well as crusting all over his eye that kept his eye shut.  He reports that his son did have this last week.  He denies any fevers or chills.  He denies any loss of vision.  He is having photosensitivity.   Eye Problem Associated symptoms: discharge, photophobia and redness   Associated symptoms: no vomiting     Past Medical History:  Diagnosis Date   Acute nonintractable headache 03/06/2022   Deviated septum     Patient Active Problem List   Diagnosis Date Noted   Urinary hesitancy 04/17/2022   Rectal pain 04/17/2022   Frequent urination 03/06/2022   Frequent diarrhea 03/06/2022   Bilious vomiting with nausea 03/06/2022   Elevated BP without diagnosis of hypertension 03/06/2022   Substernal chest pain 03/06/2022   Neck and shoulder pain 03/06/2022   Cervicogenic headache 03/06/2022   Chronic left shoulder pain 07/18/2017    Past Surgical History:  Procedure Laterality Date   left shoulder surgery         Home Medications    Prior to Admission medications   Medication Sig Start Date End Date Taking? Authorizing Provider  erythromycin ophthalmic ointment Place a 1/2 inch ribbon of ointment into the right lower eyelid twice daily for 7 days. 09/26/24  Yes Mannat Benedetti A, PA-C  acetaminophen  (TYLENOL ) 500 MG tablet Take 2 tablets (1,000 mg total) by mouth every 6 (six) hours as needed. Patient not taking: Reported on 01/24/2023 05/15/22   Enedelia Dorna HERO, FNP  albuterol  (VENTOLIN  HFA) 108 [phone] Base) MCG/ACT inhaler Inhale 1-2 puffs into the lungs every  6 (six) hours as needed for wheezing or shortness of breath. Patient not taking: Reported on 01/24/2023 05/15/22   Enedelia Dorna HERO, FNP  cetirizine  (ZYRTEC  ALLERGY) 10 MG tablet Take 1 tablet (10 mg total) by mouth daily. Patient not taking: Reported on 01/24/2023 12/11/22   Christopher Savannah, PA-C  Guaifenesin  1200 MG TB12 Take 1 tablet (1,200 mg total) by mouth in the morning and at bedtime. Patient not taking: Reported on 01/24/2023 05/15/22   Enedelia Dorna HERO, FNP  ibuprofen  (ADVIL ) 600 MG tablet Take 1 tablet (600 mg total) by mouth every 6 (six) hours as needed. Patient not taking: Reported on 01/24/2023 12/11/22   Christopher Savannah, PA-C  ondansetron  (ZOFRAN -ODT) 4 MG disintegrating tablet Take 1 tablet (4 mg total) by mouth every 8 (eight) hours as needed for nausea or vomiting. Patient not taking: Reported on 01/24/2023 05/15/22   Enedelia Dorna HERO, FNP  ondansetron  (ZOFRAN -ODT) 4 MG disintegrating tablet 4mg  ODT q4 hours prn nausea/vomit 01/24/23   Lenor Hollering, MD  promethazine -dextromethorphan (PROMETHAZINE -DM) 6.25-15 MG/5ML syrup Take 5 mLs by mouth 4 (four) times daily as needed for cough. Patient not taking: Reported on 01/24/2023 05/15/22   Enedelia Dorna HERO, FNP  pseudoephedrine  (SUDAFED) 60 MG tablet Take 1 tablet (60 mg total) by mouth every 8 (eight) hours as needed for congestion. Patient not taking: Reported on 01/24/2023 12/11/22   Christopher Savannah, PA-C    Family  History Family History  Family history unknown: Yes    Social History Social History   Tobacco Use   Smoking status: Former    Current packs/day: 0.10    Types: Cigarettes, Cigars   Smokeless tobacco: Never  Vaping Use   Vaping status: Former  Substance Use Topics   Alcohol use: No   Drug use: Not Currently     Allergies   Patient has no known allergies.   Review of Systems Review of Systems  Constitutional:  Negative for chills and fever.  HENT:  Negative for ear pain and sore throat.   Eyes:   Positive for photophobia, pain, discharge and redness. Negative for visual disturbance.  Respiratory:  Negative for cough and shortness of breath.   Cardiovascular:  Negative for chest pain and palpitations.  Gastrointestinal:  Negative for abdominal pain and vomiting.  Genitourinary:  Negative for dysuria and hematuria.  Musculoskeletal:  Negative for arthralgias and back pain.  Skin:  Negative for color change and rash.  Neurological:  Negative for seizures and syncope.  All other systems reviewed and are negative.    Physical Exam Triage Vital Signs ED Triage Vitals  Encounter Vitals Group     BP 09/26/24 1618 127/83     Girls Systolic BP Percentile --      Girls Diastolic BP Percentile --      Boys Systolic BP Percentile --      Boys Diastolic BP Percentile --      Pulse Rate 09/26/24 1618 80     Resp 09/26/24 1618 18     Temp --      Temp src --      SpO2 09/26/24 1618 96 %     Weight --      Height --      Head Circumference --      Peak Flow --      Pain Score 09/26/24 1617 0     Pain Loc --      Pain Education --      Exclude from Growth Chart --    No data found.  Updated Vital Signs BP 127/83 (BP Location: Right Arm)   Pulse 80   Resp 18   SpO2 96%   Visual Acuity Right Eye Distance:   Left Eye Distance:   Bilateral Distance:    Right Eye Near:   Left Eye Near:    Bilateral Near:     Physical Exam Vitals and nursing note reviewed.  Constitutional:      General: He is not in acute distress.    Appearance: He is well-developed.  HENT:     Head: Normocephalic and atraumatic.  Eyes:     General:        Right eye: Discharge present. No foreign body.     Extraocular Movements:     Right eye: Normal extraocular motion.     Conjunctiva/sclera:     Right eye: Right conjunctiva is injected. Exudate present.  Cardiovascular:     Rate and Rhythm: Normal rate and regular rhythm.     Heart sounds: No murmur heard. Pulmonary:     Effort: Pulmonary  effort is normal. No respiratory distress.     Breath sounds: Normal breath sounds.  Abdominal:     Palpations: Abdomen is soft.     Tenderness: There is no abdominal tenderness.  Musculoskeletal:        General: No swelling.     Cervical back: Neck supple.  Skin:  General: Skin is warm and dry.     Capillary Refill: Capillary refill takes less than 2 seconds.  Neurological:     Mental Status: He is alert.  Psychiatric:        Mood and Affect: Mood normal.      UC Treatments / Results  Labs (all labs ordered are listed, but only abnormal results are displayed) Labs Reviewed - No data to display  EKG   Radiology No results found.  Procedures Procedures (including critical care time)  Medications Ordered in UC Medications - No data to display  Initial Impression / Assessment and Plan / UC Course  I have reviewed the triage vital signs and the nursing notes.  Pertinent labs & imaging results that were available during my care of the patient were reviewed by me and considered in my medical decision making (see chart for details).     Acute bacterial conjunctivitis of right eye   Symptoms and physical exam findings are consistent with acute bacterial conjunctivitis of the right eye.  We will treat this with a topical antibiotic.  Will treat with the following: Erythromycin ointment apply a small amount into the eye directly twice daily for 7 days Make sure to use good hand hygiene especially before and after touching your eyes. Would avoid excessive light exposure by wearing sunglasses as conjunctivitis can make your eyes more sensitive to the sun Return to urgent care or PCP if symptoms worsen or fail to resolve.    Final Clinical Impressions(s) / UC Diagnoses   Final diagnoses:  Acute bacterial conjunctivitis of right eye     Discharge Instructions      Symptoms and physical exam findings are consistent with acute bacterial conjunctivitis of the right eye.   We will treat this with a topical antibiotic.  Will treat with the following: Erythromycin ointment apply a small amount into the eye directly twice daily for 7 days Make sure to use good hand hygiene especially before and after touching your eyes. Would avoid excessive light exposure by wearing sunglasses as conjunctivitis can make your eyes more sensitive to the sun Return to urgent care or PCP if symptoms worsen or fail to resolve.      ED Prescriptions     Medication Sig Dispense Auth. Provider   erythromycin ophthalmic ointment Place a 1/2 inch ribbon of ointment into the right lower eyelid twice daily for 7 days. 3.5 g Teresa Almarie LABOR, [place]      PDMP not reviewed this encounter.   Teresa Almarie LABOR, PA-C 09/26/24 1636
